# Patient Record
Sex: Female | Born: 1960
Health system: Southern US, Community
[De-identification: ages and names within clinical notes are randomized; demographics above are authoritative.]

## PROBLEM LIST (undated history)

## (undated) DIAGNOSIS — T7840XA Allergy, unspecified, initial encounter: Secondary | ICD-10-CM

## (undated) DIAGNOSIS — M1990 Unspecified osteoarthritis, unspecified site: Secondary | ICD-10-CM

## (undated) DIAGNOSIS — G4730 Sleep apnea, unspecified: Secondary | ICD-10-CM

## (undated) DIAGNOSIS — K219 Gastro-esophageal reflux disease without esophagitis: Secondary | ICD-10-CM

## (undated) DIAGNOSIS — J45909 Unspecified asthma, uncomplicated: Secondary | ICD-10-CM

## (undated) HISTORY — PX: OTHER SURGICAL HISTORY: SHX169

## (undated) HISTORY — DX: Unspecified osteoarthritis, unspecified site: M19.90

## (undated) HISTORY — DX: Unspecified asthma, uncomplicated: J45.909

## (undated) HISTORY — DX: Allergy, unspecified, initial encounter: T78.40XA

## (undated) HISTORY — PX: COLONOSCOPY: SHX174

## (undated) HISTORY — DX: Sleep apnea, unspecified: G47.30

## (undated) HISTORY — DX: Gastro-esophageal reflux disease without esophagitis: K21.9

## (undated) HISTORY — PX: POLYPECTOMY: SHX149

---
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
## 1999-05-27 ENCOUNTER — Encounter: Admission: RE | Admit: 1999-05-27 | Discharge: 1999-05-27 | Payer: Self-pay | Admitting: Orthopaedic Surgery

## 1999-05-27 ENCOUNTER — Encounter: Payer: Self-pay | Admitting: Orthopaedic Surgery

## 2010-02-10 ENCOUNTER — Encounter: Payer: Self-pay | Admitting: Family Medicine

## 2010-04-05 ENCOUNTER — Ambulatory Visit (INDEPENDENT_AMBULATORY_CARE_PROVIDER_SITE_OTHER): Payer: BC Managed Care – PPO | Admitting: Family Medicine

## 2010-04-05 ENCOUNTER — Encounter: Payer: Self-pay | Admitting: Family Medicine

## 2010-04-05 DIAGNOSIS — J45909 Unspecified asthma, uncomplicated: Secondary | ICD-10-CM | POA: Insufficient documentation

## 2010-04-05 DIAGNOSIS — H9209 Otalgia, unspecified ear: Secondary | ICD-10-CM

## 2010-04-05 DIAGNOSIS — J309 Allergic rhinitis, unspecified: Secondary | ICD-10-CM | POA: Insufficient documentation

## 2010-04-05 DIAGNOSIS — R030 Elevated blood-pressure reading, without diagnosis of hypertension: Secondary | ICD-10-CM | POA: Insufficient documentation

## 2010-04-09 NOTE — Assessment & Plan Note (Signed)
Summary: RIGHT EAR HURTING   Vital Signs:  Patient Profile:   50 Years Old Female CC:      Right Ear Pain O2 Sat:      97 % O2 treatment:    Room Air Temp:     98.9 degrees F oral Pulse rate:   96 / minute Pulse rhythm:   regular Resp:     13 per minute BP sitting:   141 / 88  (right arm) Cuff size:   regular  Pt. in pain?   yes    Location:   right ear  Vitals Entered By: Standley Dakins MD (April 05, 2010 1:52 PM)                   Current Allergies (reviewed today): No known allergies History of Present Illness History from: patient Chief Complaint: Right Ear Pain History of Present Illness: The patient presented today for evaluation of right ear pain, getting progressively worse over the past week.  She is reporting that she has had sinus pressure but no other symptoms of a cold or fever or chills.  It has really only been a symptom of the right ear pain that brought her in to be evaluated.  It has not been bad enough for her to have taken any medications so far.     REVIEW OF SYSTEMS Constitutional Symptoms      Denies fever, chills, night sweats, weight loss, weight gain, and fatigue.  Eyes       Denies change in vision, eye pain, eye discharge, glasses, contact lenses, and eye surgery. Ear/Nose/Throat/Mouth       Complains of ear pain.      Denies hearing loss/aids, change in hearing, ear discharge, dizziness, frequent runny nose, frequent nose bleeds, sinus problems, sore throat, hoarseness, and tooth pain or bleeding.      Comments: right ear only Respiratory       Denies dry cough, productive cough, wheezing, shortness of breath, asthma, bronchitis, and emphysema/COPD.  Cardiovascular       Denies murmurs, chest pain, and tires easily with exhertion.    Gastrointestinal       Denies stomach pain, nausea/vomiting, diarrhea, constipation, blood in bowel movements, and indigestion. Genitourniary       Denies painful urination, kidney stones, and loss of  urinary control. Neurological       Denies paralysis, seizures, and fainting/blackouts. Musculoskeletal       Denies muscle pain, joint pain, joint stiffness, decreased range of motion, redness, swelling, muscle weakness, and gout.  Skin       Denies bruising, unusual mles/lumps or sores, and hair/skin or nail changes.  Psych       Denies mood changes, temper/anger issues, anxiety/stress, speech problems, depression, and sleep problems.  Past History:  Family History: Last updated: 04/05/2010 Pt is adopted  Social History: Last updated: 04/05/2010 Never Smoked Alcohol use-no Drug use-no  Risk Factors: Smoking Status: never (04/05/2010)  Past Medical History: Reactive Asthma Allergic Rhinitis Hypertension of pregnancy ?(Pre-eclampsia)  Past Surgical History: Caesarean section - 1997  Family History: Pt is adopted  Social History: Never Smoked Alcohol use-no Drug use-no Smoking Status:  never Drug Use:  no Physical Exam General appearance: well developed, well nourished, no acute distress Head: normocephalic, atraumatic Eyes: conjunctivae and lids normal Pupils: equal, round, reactive to light Ears: normal, no lesions or deformities Nasal: largely swollen red turbinates with congestion Oral/Pharynx: tongue normal, posterior pharynx without erythema or exudate Neck: neck  supple,  trachea midline, no masses Chest/Lungs: no rales, wheezes, or rhonchi bilateral, breath sounds equal without effort Extremities: normal extremities Neurological: grossly intact and non-focal Skin: no obvious rashes or lesions MSE: oriented to time, place, and person Assessment Problems:   New Problems: ALLERGIC RHINITIS CAUSE UNSPECIFIED (ICD-477.9) ELEVATED BP READING WITHOUT DX HYPERTENSION (ICD-796.2) Hx of ASTHMA (ICD-493.90) EAR PAIN, RIGHT (ICD-388.70)   Patient Education: Patient and/or caregiver instructed in the following: rest, fluids, Tylenol prn, exercise, weight  loss. The risks, benefits and possible side effects were clearly explained and discussed with the patient.  The patient verbalized clear understanding.  The patient was given instructions to return if symptoms don't improve, worsen or new changes develop.  If it is not during clinic hours and the patient cannot get back to this clinic then the patient was told to seek medical care at an available urgent care or emergency department.  The patient verbalized understanding.   Demonstrates willingness to comply.  Plan New Medications/Changes: ZYRTEC ALLERGY 10 MG TABS (CETIRIZINE HCL) take 1 by mouth daily for allergies  #30 x 1, 04/05/2010, Clanford Smalling MD ZYRTEC-D ALLERGY & CONGESTION 5-120 MG XR12H-TAB (CETIRIZINE-PSEUDOEPHEDRINE) take 1 by mouth every 12 hours x 3 days THEN take regular zyrtec once daily  #6 x 0, 04/05/2010, Clanford Booze MD FLUTICASONE PROPIONATE 50 MCG/ACT SUSP (FLUTICASONE PROPIONATE) 2 sprays per nostril once daily  #1 x 1, 04/05/2010, Clanford Newcom MD  Follow Up: Follow up in 2-3 days if no improvement, Follow up on an as needed basis, Follow up with Primary Physician  The patient and/or caregiver has been counseled thoroughly with regard to medications prescribed including dosage, schedule, interactions, rationale for use, and possible side effects and they verbalize understanding.  Diagnoses and expected course of recovery discussed and will return if not improved as expected or if the condition worsens. Patient and/or caregiver verbalized understanding.  Prescriptions: ZYRTEC ALLERGY 10 MG TABS (CETIRIZINE HCL) take 1 by mouth daily for allergies  #30 x 1   Entered and Authorized by:   Standley Dakins MD   Signed by:   Standley Dakins MD on 04/05/2010   Method used:   Electronically to        [facility] [address] (retail)       [address], [address]       [place], [place]  [address]       Ph: [phone]       Fax:  [phone]   RxID:   [id number] ZYRTEC-D ALLERGY & CONGESTION 5-120 MG XR12H-TAB (CETIRIZINE-PSEUDOEPHEDRINE) take 1 by mouth every 12 hours x 3 days THEN take regular zyrtec once daily  #6 x 0   Entered and Authorized by:   Standley Dakins MD   Signed by:   Standley Dakins MD on 04/05/2010   Method used:   Electronically to        [facility] [address] (retail)       [address], [address]       [place], [place]  [address]       Ph: [phone]       Fax: [phone]   RxID:   [id number] FLUTICASONE PROPIONATE 50 MCG/ACT SUSP (FLUTICASONE PROPIONATE) 2 sprays per nostril once daily  #1 x 1   Entered and Authorized by:   Standley Dakins MD   Signed by:   Standley Dakins MD  on 04/05/2010   Method used:   Electronically to        [facility] [address] (retail)       [address]       [place], [place]  [address]       Ph: [phone]       Fax: [phone]   RxID:   [id number]   Patient Instructions: 1)  Take Zyrtec D for the next 3 days then start taking Zyrtec 10 mg daily. 2)  Start Flonase (fluticasone) nasal spray - 2 sprays per nostril once daily.  3)  Return or go to the ER if no improvement or symptoms getting worse.   4)  Check your Blood Pressure regularly. If it is above: 140/90 you should make an appointment. 5)  Follow up with your primary care physician to be rechecked in 1 week.   6)  The patient was informed that there is no on-call provider or services available at this clinic during off-hours (when the clinic is closed).  If the patient developed a problem or concern that required immediate attention, the patient was advised to go the the nearest available urgent care or emergency department for medical care.  The patient verbalized understanding.

## 2015-02-20 ENCOUNTER — Other Ambulatory Visit (HOSPITAL_COMMUNITY)
Admission: RE | Admit: 2015-02-20 | Discharge: 2015-02-20 | Disposition: A | Discharge status: Home / Self Care | Payer: BLUE CROSS/BLUE SHIELD | Source: Ambulatory Visit | Attending: Internal Medicine | Admitting: Internal Medicine

## 2015-02-20 DIAGNOSIS — Z01419 Encounter for gynecological examination (general) (routine) without abnormal findings: Secondary | ICD-10-CM | POA: Diagnosis present

## 2015-02-20 DIAGNOSIS — Z1151 Encounter for screening for human papillomavirus (HPV): Secondary | ICD-10-CM | POA: Diagnosis present

## 2015-03-21 ENCOUNTER — Telehealth: Payer: Self-pay

## 2015-03-21 ENCOUNTER — Ambulatory Visit (AMBULATORY_SURGERY_CENTER): Payer: Self-pay

## 2015-03-21 VITALS — Ht 64.0 in | Wt 279.0 lb

## 2015-03-21 DIAGNOSIS — Z1211 Encounter for screening for malignant neoplasm of colon: Secondary | ICD-10-CM

## 2015-03-21 MED ORDER — NA SULFATE-K SULFATE-MG SULF 17.5-3.13-1.6 GM/177ML PO SOLN: 1.0000 | Freq: Once | ORAL | Status: DC

## 2015-03-21 NOTE — Telephone Encounter (Signed)
Called pt to advise no need to hold diet drug (Contrave) per Lucretia Kern, CRNA prior to colonoscopy. Abigail Butts PV

## 2015-03-21 NOTE — Progress Notes (Signed)
No egg or soy allergies Not on home 02 Pt experienced hypotension post procedure (once), no other anesthesia complications. Emmi video declined.  Pt agreed to hold diet drug (Contrave) for 10 days prior to procedure, starting on 03-27-2015.

## 2015-04-06 ENCOUNTER — Encounter: Payer: Self-pay | Admitting: Gastroenterology

## 2015-04-06 ENCOUNTER — Ambulatory Visit (AMBULATORY_SURGERY_CENTER): Payer: BLUE CROSS/BLUE SHIELD | Admitting: Gastroenterology

## 2015-04-06 VITALS — BP 131/69 | HR 90 | Temp 97.5°F | Resp 17 | Ht 64.0 in | Wt 279.0 lb

## 2015-04-06 DIAGNOSIS — D124 Benign neoplasm of descending colon: Secondary | ICD-10-CM

## 2015-04-06 DIAGNOSIS — Z1211 Encounter for screening for malignant neoplasm of colon: Secondary | ICD-10-CM

## 2015-04-06 DIAGNOSIS — D125 Benign neoplasm of sigmoid colon: Secondary | ICD-10-CM | POA: Diagnosis not present

## 2015-04-06 MED ORDER — SODIUM CHLORIDE 0.9 % IV SOLN: 500.0000 mL | INTRAVENOUS | Status: DC

## 2015-04-06 NOTE — Progress Notes (Signed)
Called to room to assist during endoscopic procedure.  Patient ID and intended procedure confirmed with present staff. Received instructions for my participation in the procedure from the performing physician.  

## 2015-04-06 NOTE — Progress Notes (Signed)
No egg or soy allergy known to patient  No issues with past sedation with any surgeries  or procedures, no intubation problems  No diet pills per patient No home 02 use per patient  No blood thinners per patient  Pt denies issues with constipation   

## 2015-04-06 NOTE — Progress Notes (Signed)
A and O x 3 Report to Tracey RN 

## 2015-04-06 NOTE — Op Note (Signed)
Julie Grimes Patient Name: Julie Grimes Procedure Date: 04/06/2015 10:36 AM MRN: [id number] Endoscopist: Mauri Pole , MD Age: 55 Referring MD:  Date of Birth: [birth year] Gender: Female Procedure:                Colonoscopy Indications:              Screening for colorectal malignant neoplasm Medicines:                Propofol total dose 400 mg IV, Monitored Anesthesia                            Care Procedure:                Pre-Anesthesia Assessment:                           - Prior to the procedure, a History and Physical                            was performed, and patient medications and                            allergies were reviewed. The patient's tolerance of                            previous anesthesia was also reviewed. The risks                            and benefits of the procedure and the sedation                            options and risks were discussed with the patient.                            All questions were answered, and informed consent                            was obtained. Prior Anticoagulants: The patient has                            taken no previous anticoagulant or antiplatelet                            agents. ASA Grade Assessment: II - A patient with                            mild systemic disease. After reviewing the risks                            and benefits, the patient was deemed in                            satisfactory condition to undergo the procedure.  After obtaining informed consent, the colonoscope                            was passed under direct vision. Throughout the                            procedure, the patient's blood pressure, pulse, and                            oxygen saturations were monitored continuously. The                            Model CF-HQ190L [id number]) scope was introduced                            through the anus and advanced to the the cecum,                          identified by appendiceal orifice and ileocecal                            valve. The colonoscopy was performed without                            difficulty. The patient tolerated the procedure                            well. The quality of the bowel preparation was                            adequate. The quality of the bowel preparation was                            adequate. The ileocecal valve, appendiceal orifice,                            and rectum were photographed. Scope In: 10:51:16 AM Scope Out: 11:12:48 AM Scope Withdrawal Time: 0 hours 19 minutes 13 seconds  Total Procedure Duration: 0 hours 21 minutes 32 seconds  Findings:      The perianal and digital rectal examinations were normal.      A 8 mm polyp was found in the descending colon. The polyp was sessile.       The polyp was removed with a cold snare. Resection and retrieval were       complete.      A 25 mm polyp was found in the sigmoid colon. The polyp was       pedunculated. The polyp was removed with a hot snare. Resection and       retrieval were complete. To prevent bleeding after the polypectomy, two       hemostatic clips were successfully placed. There was no bleeding at the       end of the procedure. Complications:            No immediate complications. Estimated Blood Loss:     Estimated blood loss was minimal. Impression:               -  One 8 mm polyp in the descending colon, removed                            with a cold snare. Resected and retrieved.                           - One 25 mm polyp in the sigmoid colon, removed                            with a hot snare. Resected and retrieved. Clips                            were placed. Recommendation:           - Patient has a contact number available for                            emergencies. The signs and symptoms of potential                            delayed complications were discussed with the                             patient. Return to normal activities tomorrow.                            Written discharge instructions were provided to the                            patient.                           - Resume previous diet.                           - Continue present medications.                           - Await pathology results.                           - Repeat colonoscopy in 3 years for surveillance                            based on pathology results. Procedure Code(s):        --- Professional ---                           [phone], Colonoscopy, flexible; with removal of                            tumor(s), polyp(s), or other lesion(s) by snare                            technique CPT copyright 2016 [employer]. All rights reserved. Mauri Pole, MD 04/06/2015  11:19:46 AM This report has been signed electronically. Number of Addenda: 0

## 2015-04-06 NOTE — Patient Instructions (Signed)
Impressions/recommendations:  Polyps (handout given)  Repeat colonoscopy pending pathology results.  YOU HAD AN ENDOSCOPIC PROCEDURE TODAY AT THE [facility]:   Refer to the procedure report that was given to you for any specific questions about what was found during the examination.  If the procedure report does not answer your questions, please call your gastroenterologist to clarify.  If you requested that your care partner not be given the details of your procedure findings, then the procedure report has been included in a sealed envelope for you to review at your convenience later.  YOU SHOULD EXPECT: Some feelings of bloating in the abdomen. Passage of more gas than usual.  Walking can help get rid of the air that was put into your GI tract during the procedure and reduce the bloating. If you had a lower endoscopy (such as a colonoscopy or flexible sigmoidoscopy) you may notice spotting of blood in your stool or on the toilet paper. If you underwent a bowel prep for your procedure, you may not have a normal bowel movement for a few days.  Please Note:  You might notice some irritation and congestion in your nose or some drainage.  This is from the oxygen used during your procedure.  There is no need for concern and it should clear up in a day or so.  SYMPTOMS TO REPORT IMMEDIATELY:   Following lower endoscopy (colonoscopy or flexible sigmoidoscopy):  Excessive amounts of blood in the stool  Significant tenderness or worsening of abdominal pains  Swelling of the abdomen that is new, acute  Fever of 100F or higher  For urgent or emergent issues, a gastroenterologist can be reached at any hour by calling [phone].   DIET: Your first meal following the procedure should be a small meal and then it is ok to progress to your normal diet. Heavy or fried foods are harder to digest and may make you feel nauseous or bloated.  Likewise, meals heavy in dairy and vegetables can  increase bloating.  Drink plenty of fluids but you should avoid alcoholic beverages for 24 hours.  ACTIVITY:  You should plan to take it easy for the rest of today and you should NOT DRIVE or use heavy machinery until tomorrow (because of the sedation medicines used during the test).    FOLLOW UP: Our staff will call the number listed on your records the next business day following your procedure to check on you and address any questions or concerns that you may have regarding the information given to you following your procedure. If we do not reach you, we will leave a message.  However, if you are feeling well and you are not experiencing any problems, there is no need to return our call.  We will assume that you have returned to your regular daily activities without incident.  If any biopsies were taken you will be contacted by phone or by letter within the next 1-3 weeks.  Please call us at [phone] if you have not heard about the biopsies in 3 weeks.    SIGNATURES/CONFIDENTIALITY: You and/or your care partner have signed paperwork which will be entered into your electronic medical record.  These signatures attest to the fact that that the information above on your After Visit Summary has been reviewed and is understood.  Full responsibility of the confidentiality of this discharge information lies with you and/or your care-partner. 

## 2015-04-09 ENCOUNTER — Telehealth: Payer: Self-pay | Admitting: *Deleted

## 2015-04-09 NOTE — Telephone Encounter (Signed)
  Follow up Call-  Call back number 04/06/2015  Post procedure Call Back phone  # [phone]  Permission to leave phone message Yes     Patient questions:  Do you have a fever, pain , or abdominal swelling? No. Pain Score  0 *  Have you tolerated food without any problems? Yes.    Have you been able to return to your normal activities? Yes.    Do you have any questions about your discharge instructions: Diet   No. Medications  No. Follow up visit  No.  Do you have questions or concerns about your Care? No.  Actions: * If pain score is 4 or above: No action needed, pain <4.

## 2015-04-11 DIAGNOSIS — G4733 Obstructive sleep apnea (adult) (pediatric): Secondary | ICD-10-CM | POA: Diagnosis not present

## 2015-04-16 ENCOUNTER — Encounter: Payer: Self-pay | Admitting: Gastroenterology

## 2015-05-12 DIAGNOSIS — G4733 Obstructive sleep apnea (adult) (pediatric): Secondary | ICD-10-CM | POA: Diagnosis not present

## 2015-06-04 DIAGNOSIS — G4733 Obstructive sleep apnea (adult) (pediatric): Secondary | ICD-10-CM | POA: Diagnosis not present

## 2015-06-11 DIAGNOSIS — G4733 Obstructive sleep apnea (adult) (pediatric): Secondary | ICD-10-CM | POA: Diagnosis not present

## 2015-07-12 DIAGNOSIS — G4733 Obstructive sleep apnea (adult) (pediatric): Secondary | ICD-10-CM | POA: Diagnosis not present

## 2015-07-17 DIAGNOSIS — G4733 Obstructive sleep apnea (adult) (pediatric): Secondary | ICD-10-CM | POA: Diagnosis not present

## 2015-08-24 DIAGNOSIS — G4733 Obstructive sleep apnea (adult) (pediatric): Secondary | ICD-10-CM | POA: Diagnosis not present

## 2015-10-20 DIAGNOSIS — G4733 Obstructive sleep apnea (adult) (pediatric): Secondary | ICD-10-CM | POA: Diagnosis not present

## 2015-11-20 DIAGNOSIS — G4733 Obstructive sleep apnea (adult) (pediatric): Secondary | ICD-10-CM | POA: Diagnosis not present

## 2015-12-03 DIAGNOSIS — H524 Presbyopia: Secondary | ICD-10-CM | POA: Diagnosis not present

## 2015-12-03 DIAGNOSIS — H5203 Hypermetropia, bilateral: Secondary | ICD-10-CM | POA: Diagnosis not present

## 2015-12-20 DIAGNOSIS — G4733 Obstructive sleep apnea (adult) (pediatric): Secondary | ICD-10-CM | POA: Diagnosis not present

## 2016-02-17 DIAGNOSIS — Z23 Encounter for immunization: Secondary | ICD-10-CM | POA: Diagnosis not present

## 2016-02-18 DIAGNOSIS — Z1321 Encounter for screening for nutritional disorder: Secondary | ICD-10-CM | POA: Diagnosis not present

## 2016-02-18 DIAGNOSIS — Z0000 Encounter for general adult medical examination without abnormal findings: Secondary | ICD-10-CM | POA: Diagnosis not present

## 2016-02-18 DIAGNOSIS — Z13220 Encounter for screening for lipoid disorders: Secondary | ICD-10-CM | POA: Diagnosis not present

## 2016-02-21 DIAGNOSIS — Z86010 Personal history of colonic polyps: Secondary | ICD-10-CM | POA: Diagnosis not present

## 2016-02-21 DIAGNOSIS — M170 Bilateral primary osteoarthritis of knee: Secondary | ICD-10-CM | POA: Diagnosis not present

## 2016-02-21 DIAGNOSIS — D72829 Elevated white blood cell count, unspecified: Secondary | ICD-10-CM | POA: Diagnosis not present

## 2016-02-21 DIAGNOSIS — N393 Stress incontinence (female) (male): Secondary | ICD-10-CM | POA: Diagnosis not present

## 2016-02-21 DIAGNOSIS — Z0001 Encounter for general adult medical examination with abnormal findings: Secondary | ICD-10-CM | POA: Diagnosis not present

## 2016-02-25 DIAGNOSIS — G4733 Obstructive sleep apnea (adult) (pediatric): Secondary | ICD-10-CM | POA: Diagnosis not present

## 2016-03-21 DIAGNOSIS — E559 Vitamin D deficiency, unspecified: Secondary | ICD-10-CM | POA: Diagnosis not present

## 2016-03-31 DIAGNOSIS — G4733 Obstructive sleep apnea (adult) (pediatric): Secondary | ICD-10-CM | POA: Diagnosis not present

## 2016-04-25 DIAGNOSIS — G4733 Obstructive sleep apnea (adult) (pediatric): Secondary | ICD-10-CM | POA: Diagnosis not present

## 2016-05-20 DIAGNOSIS — Z6841 Body Mass Index (BMI) 40.0 and over, adult: Secondary | ICD-10-CM | POA: Diagnosis not present

## 2016-05-26 DIAGNOSIS — G4733 Obstructive sleep apnea (adult) (pediatric): Secondary | ICD-10-CM | POA: Diagnosis not present

## 2016-06-04 DIAGNOSIS — G4733 Obstructive sleep apnea (adult) (pediatric): Secondary | ICD-10-CM | POA: Diagnosis not present

## 2016-10-09 DIAGNOSIS — G4733 Obstructive sleep apnea (adult) (pediatric): Secondary | ICD-10-CM | POA: Diagnosis not present

## 2016-12-08 DIAGNOSIS — Z23 Encounter for immunization: Secondary | ICD-10-CM | POA: Diagnosis not present

## 2016-12-08 DIAGNOSIS — Z6841 Body Mass Index (BMI) 40.0 and over, adult: Secondary | ICD-10-CM | POA: Diagnosis not present

## 2017-01-09 DIAGNOSIS — G4733 Obstructive sleep apnea (adult) (pediatric): Secondary | ICD-10-CM | POA: Diagnosis not present

## 2017-02-23 DIAGNOSIS — E559 Vitamin D deficiency, unspecified: Secondary | ICD-10-CM | POA: Diagnosis not present

## 2017-02-23 DIAGNOSIS — Z0000 Encounter for general adult medical examination without abnormal findings: Secondary | ICD-10-CM | POA: Diagnosis not present

## 2017-02-23 DIAGNOSIS — N390 Urinary tract infection, site not specified: Secondary | ICD-10-CM | POA: Diagnosis not present

## 2017-02-26 DIAGNOSIS — G4733 Obstructive sleep apnea (adult) (pediatric): Secondary | ICD-10-CM | POA: Diagnosis not present

## 2017-02-26 DIAGNOSIS — N393 Stress incontinence (female) (male): Secondary | ICD-10-CM | POA: Diagnosis not present

## 2017-02-26 DIAGNOSIS — M170 Bilateral primary osteoarthritis of knee: Secondary | ICD-10-CM | POA: Diagnosis not present

## 2017-02-26 DIAGNOSIS — Z0000 Encounter for general adult medical examination without abnormal findings: Secondary | ICD-10-CM | POA: Diagnosis not present

## 2017-04-10 DIAGNOSIS — G4733 Obstructive sleep apnea (adult) (pediatric): Secondary | ICD-10-CM | POA: Diagnosis not present

## 2017-05-26 DIAGNOSIS — Z01419 Encounter for gynecological examination (general) (routine) without abnormal findings: Secondary | ICD-10-CM | POA: Diagnosis not present

## 2017-05-26 DIAGNOSIS — Z23 Encounter for immunization: Secondary | ICD-10-CM | POA: Diagnosis not present

## 2017-05-26 DIAGNOSIS — Z1151 Encounter for screening for human papillomavirus (HPV): Secondary | ICD-10-CM | POA: Diagnosis not present

## 2017-05-26 DIAGNOSIS — Z1212 Encounter for screening for malignant neoplasm of rectum: Secondary | ICD-10-CM | POA: Diagnosis not present

## 2017-07-09 DIAGNOSIS — G4733 Obstructive sleep apnea (adult) (pediatric): Secondary | ICD-10-CM | POA: Diagnosis not present

## 2017-10-07 DIAGNOSIS — G4733 Obstructive sleep apnea (adult) (pediatric): Secondary | ICD-10-CM | POA: Diagnosis not present

## 2017-10-09 DIAGNOSIS — Z23 Encounter for immunization: Secondary | ICD-10-CM | POA: Diagnosis not present

## 2018-01-05 DIAGNOSIS — G4733 Obstructive sleep apnea (adult) (pediatric): Secondary | ICD-10-CM | POA: Diagnosis not present

## 2018-02-25 DIAGNOSIS — Z0000 Encounter for general adult medical examination without abnormal findings: Secondary | ICD-10-CM | POA: Diagnosis not present

## 2018-02-25 DIAGNOSIS — Z791 Long term (current) use of non-steroidal anti-inflammatories (NSAID): Secondary | ICD-10-CM | POA: Diagnosis not present

## 2018-02-25 DIAGNOSIS — D72829 Elevated white blood cell count, unspecified: Secondary | ICD-10-CM | POA: Diagnosis not present

## 2018-02-25 DIAGNOSIS — Z1159 Encounter for screening for other viral diseases: Secondary | ICD-10-CM | POA: Diagnosis not present

## 2018-03-01 DIAGNOSIS — Z0000 Encounter for general adult medical examination without abnormal findings: Secondary | ICD-10-CM | POA: Diagnosis not present

## 2018-03-01 DIAGNOSIS — Z1159 Encounter for screening for other viral diseases: Secondary | ICD-10-CM | POA: Diagnosis not present

## 2018-03-01 DIAGNOSIS — Z8709 Personal history of other diseases of the respiratory system: Secondary | ICD-10-CM | POA: Diagnosis not present

## 2018-03-01 DIAGNOSIS — N393 Stress incontinence (female) (male): Secondary | ICD-10-CM | POA: Diagnosis not present

## 2018-03-10 ENCOUNTER — Other Ambulatory Visit: Payer: Self-pay | Admitting: Internal Medicine

## 2018-03-10 DIAGNOSIS — Z1231 Encounter for screening mammogram for malignant neoplasm of breast: Secondary | ICD-10-CM

## 2018-03-10 DIAGNOSIS — Z780 Asymptomatic menopausal state: Secondary | ICD-10-CM

## 2018-04-06 DIAGNOSIS — G4733 Obstructive sleep apnea (adult) (pediatric): Secondary | ICD-10-CM | POA: Diagnosis not present

## 2018-05-07 ENCOUNTER — Ambulatory Visit: Payer: BLUE CROSS/BLUE SHIELD

## 2018-05-07 ENCOUNTER — Other Ambulatory Visit: Payer: BLUE CROSS/BLUE SHIELD

## 2018-05-27 ENCOUNTER — Encounter: Payer: Self-pay | Admitting: Gastroenterology

## 2018-06-22 ENCOUNTER — Other Ambulatory Visit: Payer: Self-pay

## 2018-06-22 ENCOUNTER — Ambulatory Visit
Admission: EM | Admit: 2018-06-22 | Discharge: 2018-06-22 | Disposition: A | Discharge status: Home / Self Care | Payer: BC Managed Care – PPO | Attending: Physician Assistant | Admitting: Physician Assistant

## 2018-06-22 DIAGNOSIS — L259 Unspecified contact dermatitis, unspecified cause: Secondary | ICD-10-CM

## 2018-06-22 DIAGNOSIS — R21 Rash and other nonspecific skin eruption: Secondary | ICD-10-CM

## 2018-06-22 MED ORDER — PREDNISONE 50 MG PO TABS: 50.0000 mg | ORAL_TABLET | Freq: Every day | ORAL | 0 refills | Status: DC

## 2018-06-22 NOTE — ED Provider Notes (Signed)
[facility]    CSN: [id number] Arrival date & time: 06/22/18  1442     History   Chief Complaint Chief Complaint  Patient presents with  . Rash    HPI Julie Grimes is a 58 y.o. female.   58 year old female comes in for 3 day history of rash. States started at the right posterior thigh, and has not spread to buttock, inner thigh and axilla. Area is itching in sensation with swelling. Denies pain. Denies spreading erythema, warmth, fever. Denies obvious new contact/exposures. States she does have pets, and unsure if maybe they brought something in. Has been using otc cream without relief.      Past Medical History:  Diagnosis Date  . Arthritis    osteoarthritis  . Asthma    reactive  . GERD (gastroesophageal reflux disease)   . Sleep apnea    cpap nightly    Patient Active Problem List   Diagnosis Date Noted  . ALLERGIC RHINITIS CAUSE UNSPECIFIED 04/05/2010  . ASTHMA 04/05/2010  . ELEVATED BP READING WITHOUT DX HYPERTENSION 04/05/2010    Past Surgical History:  Procedure Laterality Date  . CESAREAN SECTION  1997  . WISDOM TOOTH EXTRACTION  1981    OB History   No obstetric history on file.      Home Medications    Prior to Admission medications   Medication Sig Start Date End Date Taking? Authorizing Provider  mirabegron ER (MYRBETRIQ) 50 MG TB24 tablet Take by mouth 2 (two) times daily.     [provider]  Naltrexone-Bupropion HCl ER (CONTRAVE) 8-90 MG TB12 Take by mouth.    [provider]  naproxen sodium (ANAPROX) 220 MG tablet Take 220 mg by mouth as needed.    [provider]  predniSONE (DELTASONE) 50 MG tablet Take 1 tablet (50 mg total) by mouth daily with breakfast. 06/22/18   Ok Edwards, PA-C    Family History Family History  Adopted: Yes    Social History Social History   Tobacco Use  . Smoking status: Never Smoker  . Smokeless tobacco: Never Used  Substance Use Topics  . Alcohol use: Yes   Alcohol/week: 0.0 standard drinks    Comment: occasionally  . Drug use: No     Allergies   Stadol [butorphanol]   Review of Systems Review of Systems  Reason unable to perform ROS: See HPI as above.     Physical Exam Triage Vital Signs ED Triage Vitals  Enc Vitals Group     BP 06/22/18 1502 128/78     Pulse Rate 06/22/18 1502 98     Resp 06/22/18 1502 20     Temp 06/22/18 1502 98.9 F (37.2 C)     Temp Source 06/22/18 1502 Oral     SpO2 06/22/18 1502 98 %     Weight --      Height --      Head Circumference --      Peak Flow --      Pain Score 06/22/18 1503 0     Pain Loc --      Pain Edu? --      Excl. in [place]? --    No data found.  Updated Vital Signs BP 128/78 (BP Location: Left Arm)   Pulse 98   Temp 98.9 F (37.2 C) (Oral)   Resp 20   LMP 06/15/2018   SpO2 98%   Physical Exam Constitutional:      General: She  is not in acute distress.    Appearance: She is well-developed. She is not diaphoretic.  HENT:     Head: Normocephalic and atraumatic.  Eyes:     Conjunctiva/sclera: Conjunctivae normal.     Pupils: Pupils are equal, round, and reactive to light.  Skin:    Comments: Erythema with maculopapular rash along the right posterior thigh extending to the buttock. No moisture/shiny red skin. No pain on palpation. Similar rash to the axilla. Warm to palpation.   Neurological:     Mental Status: She is alert and oriented to person, place, and time.      UC Treatments / Results  Labs (all labs ordered are listed, but only abnormal results are displayed) Labs Reviewed - No data to display  EKG None  Radiology No results found.  Procedures Procedures (including critical care time)  Medications Ordered in UC Medications - No data to display  Initial Impression / Assessment and Plan / UC Course  I have reviewed the triage vital signs and the nursing notes.  Pertinent labs & imaging results that were available during my care of the patient  were reviewed by me and considered in my medical decision making (see chart for details).    Discussed possible contact dermatitis vs tinea infection causing symptoms. Given not only in skin folds, history and exam more likely contact dermatitis will provide prednisone at this time. Patient to continue to monitor. Return precautions given. Patient expresses understanding and agrees to plan.  Final Clinical Impressions(s) / UC Diagnoses   Final diagnoses:  Contact dermatitis, unspecified contact dermatitis type, unspecified trigger   ED Prescriptions    Medication Sig Dispense Auth. Provider   predniSONE (DELTASONE) 50 MG tablet Take 1 tablet (50 mg total) by mouth daily with breakfast. 5 tablet Tobin Chad, [place] 06/22/18 1623

## 2018-06-22 NOTE — Discharge Instructions (Signed)
Start prednisone as directed. Ice compress. Continue zyrtec, add on benadryl as needed. Monitor for spreading redness, increased warmth, pain, fever, follow up for reevaluation needed.

## 2018-06-22 NOTE — ED Triage Notes (Signed)
Pt c/o rash to back of rt leg going up to buttocks, now to lt leg and under both arms. State's this is the 3 day using OTC meds with no relief

## 2018-06-24 DIAGNOSIS — L237 Allergic contact dermatitis due to plants, except food: Secondary | ICD-10-CM | POA: Diagnosis not present

## 2018-07-06 DIAGNOSIS — G4733 Obstructive sleep apnea (adult) (pediatric): Secondary | ICD-10-CM | POA: Diagnosis not present

## 2018-07-07 ENCOUNTER — Ambulatory Visit: Payer: BLUE CROSS/BLUE SHIELD

## 2018-07-07 ENCOUNTER — Inpatient Hospital Stay: Admission: RE | Admit: 2018-07-07 | Payer: BLUE CROSS/BLUE SHIELD | Source: Ambulatory Visit

## 2018-07-21 DIAGNOSIS — Z01419 Encounter for gynecological examination (general) (routine) without abnormal findings: Secondary | ICD-10-CM | POA: Diagnosis not present

## 2018-07-21 DIAGNOSIS — Z1212 Encounter for screening for malignant neoplasm of rectum: Secondary | ICD-10-CM | POA: Diagnosis not present

## 2018-10-04 DIAGNOSIS — G4733 Obstructive sleep apnea (adult) (pediatric): Secondary | ICD-10-CM | POA: Diagnosis not present

## 2018-11-10 DIAGNOSIS — Z23 Encounter for immunization: Secondary | ICD-10-CM | POA: Diagnosis not present

## 2019-01-03 DIAGNOSIS — G4733 Obstructive sleep apnea (adult) (pediatric): Secondary | ICD-10-CM | POA: Diagnosis not present

## 2019-03-03 DIAGNOSIS — Z0000 Encounter for general adult medical examination without abnormal findings: Secondary | ICD-10-CM | POA: Diagnosis not present

## 2019-03-08 DIAGNOSIS — G4733 Obstructive sleep apnea (adult) (pediatric): Secondary | ICD-10-CM | POA: Diagnosis not present

## 2019-03-08 DIAGNOSIS — Z9989 Dependence on other enabling machines and devices: Secondary | ICD-10-CM | POA: Diagnosis not present

## 2019-03-08 DIAGNOSIS — Z8709 Personal history of other diseases of the respiratory system: Secondary | ICD-10-CM | POA: Diagnosis not present

## 2019-03-08 DIAGNOSIS — Z0000 Encounter for general adult medical examination without abnormal findings: Secondary | ICD-10-CM | POA: Diagnosis not present

## 2019-03-17 DIAGNOSIS — R7301 Impaired fasting glucose: Secondary | ICD-10-CM | POA: Diagnosis not present

## 2019-03-17 DIAGNOSIS — E8881 Metabolic syndrome: Secondary | ICD-10-CM | POA: Diagnosis not present

## 2019-04-19 DIAGNOSIS — F329 Major depressive disorder, single episode, unspecified: Secondary | ICD-10-CM | POA: Diagnosis not present

## 2019-04-28 DIAGNOSIS — E8881 Metabolic syndrome: Secondary | ICD-10-CM | POA: Diagnosis not present

## 2019-04-28 DIAGNOSIS — R7301 Impaired fasting glucose: Secondary | ICD-10-CM | POA: Diagnosis not present

## 2019-07-22 DIAGNOSIS — Z1151 Encounter for screening for human papillomavirus (HPV): Secondary | ICD-10-CM | POA: Diagnosis not present

## 2019-07-22 DIAGNOSIS — Z01419 Encounter for gynecological examination (general) (routine) without abnormal findings: Secondary | ICD-10-CM | POA: Diagnosis not present

## 2019-07-22 DIAGNOSIS — F329 Major depressive disorder, single episode, unspecified: Secondary | ICD-10-CM | POA: Diagnosis not present

## 2019-11-17 DIAGNOSIS — R7301 Impaired fasting glucose: Secondary | ICD-10-CM | POA: Diagnosis not present

## 2019-11-17 DIAGNOSIS — E8881 Metabolic syndrome: Secondary | ICD-10-CM | POA: Diagnosis not present

## 2020-01-02 DIAGNOSIS — G4733 Obstructive sleep apnea (adult) (pediatric): Secondary | ICD-10-CM | POA: Diagnosis not present

## 2020-03-06 DIAGNOSIS — E7800 Pure hypercholesterolemia, unspecified: Secondary | ICD-10-CM | POA: Diagnosis not present

## 2020-03-06 DIAGNOSIS — E8881 Metabolic syndrome: Secondary | ICD-10-CM | POA: Diagnosis not present

## 2020-03-06 DIAGNOSIS — Z0000 Encounter for general adult medical examination without abnormal findings: Secondary | ICD-10-CM | POA: Diagnosis not present

## 2020-03-07 ENCOUNTER — Other Ambulatory Visit: Payer: Self-pay | Admitting: Internal Medicine

## 2020-03-07 DIAGNOSIS — E7800 Pure hypercholesterolemia, unspecified: Secondary | ICD-10-CM

## 2020-03-08 ENCOUNTER — Other Ambulatory Visit: Payer: Self-pay | Admitting: Internal Medicine

## 2020-03-08 DIAGNOSIS — Z1231 Encounter for screening mammogram for malignant neoplasm of breast: Secondary | ICD-10-CM

## 2020-03-10 ENCOUNTER — Ambulatory Visit
Admission: RE | Admit: 2020-03-10 | Discharge: 2020-03-10 | Disposition: A | Discharge status: Home / Self Care | Payer: BC Managed Care – PPO | Source: Ambulatory Visit | Attending: Internal Medicine | Admitting: Internal Medicine

## 2020-03-10 ENCOUNTER — Other Ambulatory Visit: Payer: Self-pay

## 2020-03-10 DIAGNOSIS — Z1231 Encounter for screening mammogram for malignant neoplasm of breast: Secondary | ICD-10-CM | POA: Diagnosis not present

## 2020-03-13 DIAGNOSIS — E7800 Pure hypercholesterolemia, unspecified: Secondary | ICD-10-CM | POA: Diagnosis not present

## 2020-03-13 DIAGNOSIS — R7301 Impaired fasting glucose: Secondary | ICD-10-CM | POA: Diagnosis not present

## 2020-03-13 DIAGNOSIS — Z23 Encounter for immunization: Secondary | ICD-10-CM | POA: Diagnosis not present

## 2020-03-13 DIAGNOSIS — Z0001 Encounter for general adult medical examination with abnormal findings: Secondary | ICD-10-CM | POA: Diagnosis not present

## 2020-03-13 DIAGNOSIS — F329 Major depressive disorder, single episode, unspecified: Secondary | ICD-10-CM | POA: Diagnosis not present

## 2020-03-15 ENCOUNTER — Encounter: Payer: Self-pay | Admitting: Gastroenterology

## 2020-03-16 ENCOUNTER — Other Ambulatory Visit: Payer: Self-pay | Admitting: Internal Medicine

## 2020-03-16 DIAGNOSIS — R928 Other abnormal and inconclusive findings on diagnostic imaging of breast: Secondary | ICD-10-CM

## 2020-03-27 ENCOUNTER — Ambulatory Visit
Admission: RE | Admit: 2020-03-27 | Discharge: 2020-03-27 | Disposition: A | Discharge status: Home / Self Care | Payer: No Typology Code available for payment source | Source: Ambulatory Visit | Attending: Internal Medicine | Admitting: Internal Medicine

## 2020-03-27 DIAGNOSIS — E7800 Pure hypercholesterolemia, unspecified: Secondary | ICD-10-CM

## 2020-03-27 DIAGNOSIS — Z136 Encounter for screening for cardiovascular disorders: Secondary | ICD-10-CM | POA: Diagnosis not present

## 2020-03-29 ENCOUNTER — Ambulatory Visit
Admission: RE | Admit: 2020-03-29 | Discharge: 2020-03-29 | Disposition: A | Discharge status: Home / Self Care | Payer: BC Managed Care – PPO | Source: Ambulatory Visit | Attending: Internal Medicine | Admitting: Internal Medicine

## 2020-03-29 ENCOUNTER — Other Ambulatory Visit: Payer: Self-pay

## 2020-03-29 DIAGNOSIS — R928 Other abnormal and inconclusive findings on diagnostic imaging of breast: Secondary | ICD-10-CM

## 2020-03-29 DIAGNOSIS — N6011 Diffuse cystic mastopathy of right breast: Secondary | ICD-10-CM | POA: Diagnosis not present

## 2020-04-02 DIAGNOSIS — G4733 Obstructive sleep apnea (adult) (pediatric): Secondary | ICD-10-CM | POA: Diagnosis not present

## 2020-05-08 ENCOUNTER — Ambulatory Visit (AMBULATORY_SURGERY_CENTER): Payer: Self-pay | Admitting: *Deleted

## 2020-05-08 ENCOUNTER — Other Ambulatory Visit: Payer: Self-pay

## 2020-05-08 VITALS — Ht 64.0 in | Wt 261.0 lb

## 2020-05-08 DIAGNOSIS — Z860100 Personal history of colon polyps, unspecified: Secondary | ICD-10-CM

## 2020-05-08 NOTE — Progress Notes (Signed)
No egg or soy allergy known to patient  No issues with past sedation with any surgeries or procedures Patient denies ever being told they had issues or difficulty with intubation  No FH of Malignant Hyperthermia No diet pills per patient No home 02 use per patient  No blood thinners per patient  Pt denies issues with constipation  No A fib or A flutter  EMMI video to pt or via [place] 19 guidelines implemented in [place] today with Pt and RN  Pt is fully vaccinated  for Covid   Due to the COVID-19 pandemic we are asking patients to follow certain guidelines.  Pt aware of COVID protocols and [facility] guidelines   Pt states  Last colon  Jaw extremely sore post colon from jaw thrust per [facility]- she is concerned about this happening again   With pt's insurance the only prep covered id Golytely- pt states she did not complete the Suprep last 2017 colon and she knows she cannot drink that volume- changed her to Dr Maryln Manuel  With Mag Citrate prep

## 2020-05-22 ENCOUNTER — Other Ambulatory Visit: Payer: Self-pay

## 2020-05-22 ENCOUNTER — Encounter: Payer: Self-pay | Admitting: Gastroenterology

## 2020-05-22 ENCOUNTER — Ambulatory Visit (AMBULATORY_SURGERY_CENTER): Payer: BC Managed Care – PPO | Admitting: Gastroenterology

## 2020-05-22 VITALS — BP 124/79 | HR 85 | Temp 97.3°F | Resp 20 | Ht 64.0 in | Wt 261.0 lb

## 2020-05-22 DIAGNOSIS — Z86010 Personal history of colonic polyps: Secondary | ICD-10-CM

## 2020-05-22 DIAGNOSIS — Z1211 Encounter for screening for malignant neoplasm of colon: Secondary | ICD-10-CM | POA: Diagnosis not present

## 2020-05-22 DIAGNOSIS — K621 Rectal polyp: Secondary | ICD-10-CM

## 2020-05-22 DIAGNOSIS — D128 Benign neoplasm of rectum: Secondary | ICD-10-CM

## 2020-05-22 MED ORDER — SODIUM CHLORIDE 0.9 % IV SOLN
500.0000 mL | Freq: Once | INTRAVENOUS | Status: DC
Start: 2020-05-22 — End: 2020-05-22

## 2020-05-22 NOTE — Patient Instructions (Signed)
Please read over handouts about polyps, diverticulosis and hemorrhoids  Await pathology- next colonoscopy in 5 years  Continue your normal medications  YOU HAD AN ENDOSCOPIC PROCEDURE TODAY AT [facility]:   Refer to the procedure report that was given to you for any specific questions about what was found during the examination.  If the procedure report does not answer your questions, please call your gastroenterologist to clarify.  If you requested that your care partner not be given the details of your procedure findings, then the procedure report has been included in a sealed envelope for you to review at your convenience later.  YOU SHOULD EXPECT: Some feelings of bloating in the abdomen. Passage of more gas than usual.  Walking can help get rid of the air that was put into your GI tract during the procedure and reduce the bloating. If you had a lower endoscopy (such as a colonoscopy or flexible sigmoidoscopy) you may notice spotting of blood in your stool or on the toilet paper. If you underwent a bowel prep for your procedure, you may not have a normal bowel movement for a few days.  Please Note:  You might notice some irritation and congestion in your nose or some drainage.  This is from the oxygen used during your procedure.  There is no need for concern and it should clear up in a day or so.  SYMPTOMS TO REPORT IMMEDIATELY:   Following lower endoscopy (colonoscopy or flexible sigmoidoscopy):  Excessive amounts of blood in the stool  Significant tenderness or worsening of abdominal pains  Swelling of the abdomen that is new, acute  Fever of 100F or higher  For urgent or emergent issues, a gastroenterologist can be reached at any hour by calling [phone]. Do not use MyChart messaging for urgent concerns.    DIET:  We do recommend a small meal at first, but then you may proceed to your regular diet.  Drink plenty of fluids but you should avoid alcoholic  beverages for 24 hours.  ACTIVITY:  You should plan to take it easy for the rest of today and you should NOT DRIVE or use heavy machinery until tomorrow (because of the sedation medicines used during the test).    FOLLOW UP: Our staff will call the number listed on your records 48-72 hours following your procedure to check on you and address any questions or concerns that you may have regarding the information given to you following your procedure. If we do not reach you, we will leave a message.  We will attempt to reach you two times.  During this call, we will ask if you have developed any symptoms of COVID 19. If you develop any symptoms (ie: fever, flu-like symptoms, shortness of breath, cough etc.) before then, please call [phone].  If you test positive for Covid 19 in the 2 weeks post procedure, please call and report this information to [place].    If any biopsies were taken you will be contacted by phone or by letter within the next 1-3 weeks.  Please call us at [phone] if you have not heard about the biopsies in 3 weeks.    SIGNATURES/CONFIDENTIALITY: You and/or your care partner have signed paperwork which will be entered into your electronic medical record.  These signatures attest to the fact that that the information above on your After Visit Summary has been reviewed and is understood.  Full responsibility of the confidentiality of this discharge information lies with  you and/or your care-partner.

## 2020-05-22 NOTE — Progress Notes (Signed)
Pt's states no medical or surgical changes since previsit or office visit. 

## 2020-05-22 NOTE — Op Note (Addendum)
[facility] Patient Name: Julie Grimes Procedure Date: 05/22/2020 10:39 AM MRN: [id number] Endoscopist: Mauri Pole , MD Age: 60 Referring MD:  Date of Birth: 1960/06/12 Gender: Female Account #: [id number] Procedure:                Colonoscopy Indications:              High risk colon cancer surveillance: Personal                            history of colonic polyps, High risk colon cancer                            surveillance: Personal history of adenoma (10 mm or                            greater in size) Medicines:                Monitored Anesthesia Care Procedure:                Pre-Anesthesia Assessment:                           - Prior to the procedure, a History and Physical                            was performed, and patient medications and                            allergies were reviewed. The patient's tolerance of                            previous anesthesia was also reviewed. The risks                            and benefits of the procedure and the sedation                            options and risks were discussed with the patient.                            All questions were answered, and informed consent                            was obtained. Prior Anticoagulants: The patient has                            taken no previous anticoagulant or antiplatelet                            agents. ASA Grade Assessment: III - A patient with                            severe systemic disease. After reviewing the risks  and benefits, the patient was deemed in                            satisfactory condition to undergo the procedure.                           After obtaining informed consent, the colonoscope                            was passed under direct vision. Throughout the                            procedure, the patient's blood pressure, pulse, and                            oxygen saturations were monitored  continuously. The                            Olympus PCF-[id number] (#[id number]) Colonoscope was                            introduced through the anus and advanced to the the                            cecum, identified by appendiceal orifice and                            ileocecal valve. The colonoscopy was performed                            without difficulty. The patient tolerated the                            procedure well. The quality of the bowel                            preparation was adequate. The ileocecal valve,                            appendiceal orifice, and rectum were photographed. Scope In: 10:59:06 AM Scope Out: 11:12:09 AM Scope Withdrawal Time: 0 hours 10 minutes 51 seconds  Total Procedure Duration: 0 hours 13 minutes 3 seconds  Findings:                 The perianal and digital rectal examinations were                            normal.                           A few small-mouthed diverticula were found in the                            sigmoid colon.  A less than 1 mm polyp was found in the rectum. The                            polyp was sessile. The polyp was removed with a                            cold biopsy forceps. Resection and retrieval were                            complete.                           Non-bleeding internal hemorrhoids were found during                            retroflexion. The hemorrhoids were medium-sized. Complications:            No immediate complications. Estimated Blood Loss:     Estimated blood loss was minimal. Impression:               - Diverticulosis in the sigmoid colon.                           - One less than 1 mm polyp in the rectum, removed                            with a cold biopsy forceps. Resected and retrieved.                           - Non-bleeding internal hemorrhoids. Recommendation:           - Patient has a contact number available for                             emergencies. The signs and symptoms of potential                            delayed complications were discussed with the                            patient. Return to normal activities tomorrow.                            Written discharge instructions were provided to the                            patient.                           - Resume previous diet.                           - Continue present medications.                           - Await pathology results.                           -  Repeat colonoscopy in 5 years for surveillance                            based on pathology results. Mauri Pole, MD 05/22/2020 11:15:51 AM This report has been signed electronically.

## 2020-05-22 NOTE — Progress Notes (Signed)
Called to room to assist during endoscopic procedure.  Patient ID and intended procedure confirmed with present staff. Received instructions for my participation in the procedure from the performing physician.  

## 2020-05-22 NOTE — Progress Notes (Signed)
To PACU, VSS. Report to [email or web address] 

## 2020-05-24 ENCOUNTER — Telehealth: Payer: Self-pay | Admitting: *Deleted

## 2020-05-24 ENCOUNTER — Telehealth: Payer: Self-pay

## 2020-05-24 NOTE — Telephone Encounter (Signed)
  Follow up Call-  Call back number 05/22/2020  Post procedure Call Back phone  # [phone]  Permission to leave phone message Yes  Some recent data might be hidden     Left message

## 2020-05-24 NOTE — Telephone Encounter (Signed)
  Follow up Call-  Call back number 05/22/2020  Post procedure Call Back phone  # [phone]  Permission to leave phone message Yes  Some recent data might be hidden     Patient questions:  Do you have a fever, pain , or abdominal swelling? No. Pain Score  0 *  Have you tolerated food without any problems? Yes.    Have you been able to return to your normal activities? Yes.    Do you have any questions about your discharge instructions: Diet   No. Medications  No. Follow up visit  No.  Do you have questions or concerns about your Care? No.  Actions: * If pain score is 4 or above: No action needed, pain <4.  1. Have you developed a fever since your procedure? no  2.   Have you had an respiratory symptoms (SOB or cough) since your procedure? no  3.   Have you tested positive for COVID 19 since your procedure no  4.   Have you had any family members/close contacts diagnosed with the COVID 19 since your procedure?  no   If yes to any of these questions please route to Joylene John, RN and Joella Prince, RN

## 2020-06-08 ENCOUNTER — Encounter: Payer: Self-pay | Admitting: Gastroenterology

## 2020-06-12 DIAGNOSIS — Z23 Encounter for immunization: Secondary | ICD-10-CM | POA: Diagnosis not present

## 2020-07-02 DIAGNOSIS — G4733 Obstructive sleep apnea (adult) (pediatric): Secondary | ICD-10-CM | POA: Diagnosis not present

## 2020-10-03 DIAGNOSIS — G4733 Obstructive sleep apnea (adult) (pediatric): Secondary | ICD-10-CM | POA: Diagnosis not present

## 2021-01-02 DIAGNOSIS — G4733 Obstructive sleep apnea (adult) (pediatric): Secondary | ICD-10-CM | POA: Diagnosis not present

## 2021-03-13 DIAGNOSIS — Z0000 Encounter for general adult medical examination without abnormal findings: Secondary | ICD-10-CM | POA: Diagnosis not present

## 2021-03-18 DIAGNOSIS — F329 Major depressive disorder, single episode, unspecified: Secondary | ICD-10-CM | POA: Diagnosis not present

## 2021-03-18 DIAGNOSIS — R7301 Impaired fasting glucose: Secondary | ICD-10-CM | POA: Diagnosis not present

## 2021-03-18 DIAGNOSIS — N1831 Chronic kidney disease, stage 3a: Secondary | ICD-10-CM | POA: Diagnosis not present

## 2021-03-18 DIAGNOSIS — E7800 Pure hypercholesterolemia, unspecified: Secondary | ICD-10-CM | POA: Diagnosis not present

## 2021-03-18 DIAGNOSIS — Z0000 Encounter for general adult medical examination without abnormal findings: Secondary | ICD-10-CM | POA: Diagnosis not present

## 2021-03-19 ENCOUNTER — Other Ambulatory Visit (HOSPITAL_COMMUNITY): Payer: Self-pay

## 2021-03-19 MED ORDER — WEGOVY 0.25 MG/0.5ML ~~LOC~~ SOAJ
SUBCUTANEOUS | 1 refills | Status: AC
  Filled 2021-03-19 – 2021-03-20 (×2): qty 2, 28d supply, fill #0

## 2021-03-20 ENCOUNTER — Other Ambulatory Visit (HOSPITAL_COMMUNITY): Payer: Self-pay

## 2021-03-25 ENCOUNTER — Encounter: Payer: Self-pay | Admitting: Primary Care

## 2021-03-25 ENCOUNTER — Telehealth: Payer: Self-pay | Admitting: Primary Care

## 2021-03-25 ENCOUNTER — Other Ambulatory Visit: Payer: Self-pay

## 2021-03-25 ENCOUNTER — Ambulatory Visit (INDEPENDENT_AMBULATORY_CARE_PROVIDER_SITE_OTHER): Payer: BC Managed Care – PPO | Admitting: Primary Care

## 2021-03-25 DIAGNOSIS — G4733 Obstructive sleep apnea (adult) (pediatric): Secondary | ICD-10-CM

## 2021-03-25 DIAGNOSIS — Z9989 Dependence on other enabling machines and devices: Secondary | ICD-10-CM

## 2021-03-25 NOTE — Telephone Encounter (Signed)
I need CPAP compliance report and copy of sleep study from [facility] for this patient. Then we can place an order for new CPAP and change DME companies.  ?

## 2021-03-25 NOTE — Assessment & Plan Note (Signed)
-   Establishing as new patient for OSA management. Previously managed by Dr. Shelia Media. She reports compliance with CPAP and benefit from use. No residual daytime sleepiness. Epworth 1. No issues with pressure setting or mask. Motor life exceeded, needs new machine. We will need to get a copy of her most recent sleep study and compliance report from [facility]. Once we have received these we can place an order for new CPAP machine and change DME companies.  ?

## 2021-03-25 NOTE — Patient Instructions (Addendum)
We will need to get a copy of CPAP compliance report and copy of sleep study from [facility]. Once we have these we can then place an order for new CPAP and change DME companies.  ? ?Follow-up: ?3 months with Beth NP or sooner if needed  ? ?CPAP and BIPAP Information ?CPAP and BIPAP are methods that use air pressure to keep your airways open and to help you breathe well. CPAP and BIPAP use different amounts of pressure. Your health care provider will tell you whether CPAP or BIPAP would be more helpful for you. ?CPAP stands for "continuous positive airway pressure." With CPAP, the amount of pressure stays the same while you breathe in (inhale) and out (exhale). ?BIPAP stands for "bi-level positive airway pressure." With BIPAP, the amount of pressure will be higher when you inhale and lower when you exhale. This allows you to take larger breaths. ?CPAP or BIPAP may be used in the hospital, or your health care provider may want you to use it at home. You may need to have a sleep study before your health care provider can order a machine for you to use at home. ?What are the advantages? ?CPAP or BIPAP can be helpful if you have: ?Sleep apnea. ?Chronic obstructive pulmonary disease (COPD). ?Heart failure. ?Medical conditions that cause muscle weakness, including muscular dystrophy or amyotrophic lateral sclerosis (ALS). ?Other problems that cause breathing to be shallow, weak, abnormal, or difficult. ?CPAP and BIPAP are most commonly used for obstructive sleep apnea (OSA) to keep the airways from collapsing when the muscles relax during sleep. ?What are the risks? ?Generally, this is a safe treatment. However, problems may occur, including: ?Irritated skin or skin sores if the mask does not fit properly. ?Dry or stuffy nose or nosebleeds. ?Dry mouth. ?Feeling gassy or bloated. ?Sinus or lung infection if the equipment is not cleaned properly. ?When should CPAP or BIPAP be used? ?In most cases, the mask only needs to be  worn during sleep. Generally, the mask needs to be worn throughout the night and during any daytime naps. People with certain medical conditions may also need to wear the mask at other times, such as when they are awake. Follow instructions from your health care provider about when to use the machine. ?What happens during CPAP or BIPAP? ?Both CPAP and BIPAP are provided by a small machine with a flexible plastic tube that attaches to a plastic mask that you wear. Air is blown through the mask into your nose or mouth. The amount of pressure that is used to blow the air can be adjusted on the machine. Your health care provider will set the pressure setting and help you find the best mask for you. ?Tips for using the mask ?Because the mask needs to be snug, some people feel trapped or closed-in (claustrophobic) when first using the mask. If you feel this way, you may need to get used to the mask. One way to do this is to hold the mask loosely over your nose or mouth and then gradually apply the mask more snugly. You can also gradually increase the amount of time that you use the mask. ?Masks are available in various types and sizes. If your mask does not fit well, talk with your health care provider about getting a different one. Some common types of masks include: ?Full face masks, which fit over the mouth and nose. ?Nasal masks, which fit over the nose. ?Nasal pillow or prong masks, which fit into the nostrils. ?  If you are using a mask that fits over your nose and you tend to breathe through your mouth, a chin strap may be applied to help keep your mouth closed. ?Use a skin barrier to protect your skin as told by your health care provider. ?Some CPAP and BIPAP machines have alarms that may sound if the mask comes off or develops a leak. ?If you have trouble with the mask, it is very important that you talk with your health care provider about finding a way to make the mask easier to tolerate. Do not stop using the  mask. There could be a negative impact on your health if you stop using the mask. ?Tips for using the machine ?Place your CPAP or BIPAP machine on a secure table or stand near an electrical outlet. ?Know where the on/off switch is on the machine. ?Follow instructions from your health care provider about how to set the pressure on your machine and when you should use it. ?Do not eat or drink while the CPAP or BIPAP machine is on. Food or fluids could get pushed into your lungs by the pressure of the CPAP or BIPAP. ?For home use, CPAP and BIPAP machines can be rented or purchased through home health care companies. Many different brands of machines are available. Renting a machine before purchasing may help you find out which particular machine works well for you. Your health insurance company may also decide which machine you may get. ?Keep the CPAP or BIPAP machine and attachments clean. Ask your health care provider for specific instructions. ?Check the humidifier if you have a dry stuffy nose or nosebleeds. Make sure it is working correctly. ?Follow these instructions at home: ?Take over-the-counter and prescription medicines only as told by your health care provider. Ask if you can take sinus medicine if your sinuses are blocked. ?Do not use any products that contain nicotine or tobacco. These products include cigarettes, chewing tobacco, and vaping devices, such as e-cigarettes. If you need help quitting, ask your health care provider. ?Keep all follow-up visits. This is important. ?Contact a health care provider if: ?You have redness or pressure sores on your head, face, mouth, or nose from the mask or head gear. ?You have trouble using the CPAP or BIPAP machine. ?You cannot tolerate wearing the CPAP or BIPAP mask. ?Someone tells you that you snore even when wearing your CPAP or BIPAP. ?Get help right away if: ?You have trouble breathing. ?You feel confused. ?Summary ?CPAP and BIPAP are methods that use air  pressure to keep your airways open and to help you breathe well. ?If you have trouble with the mask, it is very important that you talk with your health care provider about finding a way to make the mask easier to tolerate. Do not stop using the mask. There could be a negative impact to your health if you stop using the mask. ?Follow instructions from your health care provider about when to use the machine. ?This information is not intended to replace advice given to you by your health care provider. Make sure you discuss any questions you have with your health care provider. ?Document Revised: 08/15/2020 Document Reviewed: 12/16/2019 ?Elsevier Patient Education ? [place]. ? ?

## 2021-03-25 NOTE — Progress Notes (Addendum)
? ?'@Patient'$  ID: Julie Grimes, female    DOB: [birth year], 61 y.o.   MRN: [id number] ? ?Chief Complaint  ?Patient presents with  ? Consult  ?  Pt states she needs new CPAP machine. Pt states she has had a sleep study many years ago.   ? ? ?Referring provider: ?Deland Pretty, MD ? ?HPI: ?61 year old female, never. PMH significant for OSA, asthma and allergic rhinitis.  ? ?03/25/2021 ?Patient presents today to establish as new patient for OSA. She has hx sleep apnea, current on CPAP and needs new CPAP machine. She is unsure location and date of sleep study, believed it was done in [place]. Dr. Shelia Media was previously managing her sleep apnea. States that she received a message on her CAPP that the motor life had been exceeded. She at times is suffocating d/t hot air the last couple of months. Currently using resmed airsense 10.  No issues with pressure setting or masking fit. She uses nasal pillow mask. She reports compliance with CPAP every night average 8-9 hours. Denies daytime sleepiness or fatigue. Her current DME company id [employer], she would like to change companies. She does not need new supplies.  ? ?Sleep questionnaire ?Symptoms- Hx OSA   ?Prior sleep study- Yes, patient is unsure date. No record in chart. DME [employer].  ?Bedtime- 11pm ?Time to fall asleep- 20-56mns ?Nocturnal awakenings- twice ?Out of bed/start of day- 8am ?Weight changes- Up, unknown ?Do you operate heavy machinery- No ?Do you currently wear CPAP- Yes ?Do you current wear oxygen- No ?Epworth- 1 ? ? ?Allergies  ?Allergen Reactions  ? Stadol [Butorphanol] Other (See Comments)  ?  hypotension  ? [place] Venom   ? ? ? ?There is no immunization history on file for this patient. ? ?Past Medical History:  ?Diagnosis Date  ? Allergy   ? Arthritis   ? osteoarthritis  ? Asthma   ? reactive  ? GERD (gastroesophageal reflux disease)   ? minimal  ? Sleep apnea   ? cpap nightly  ? ? ?Tobacco History: ?Social History  ? ?Tobacco Use  ?Smoking Status  Never  ?Smokeless Tobacco Never  ? ?Counseling given: Not Answered ? ? ?Outpatient Medications Prior to Visit  ?Medication Sig Dispense Refill  ? albuterol (VENTOLIN HFA) 108 (90 Base) MCG/ACT inhaler Inhale 2 puffs into the lungs every 4 (four) hours as needed.    ? buPROPion (WELLBUTRIN XL) 300 MG 24 hr tablet Take 1 tablet by mouth daily.    ? Cholecalciferol 50 MCG (2000 UT) CAPS 4 to 5 capsules    ? mirabegron ER (MYRBETRIQ) 50 MG TB24 tablet Take by mouth 2 (two) times daily.     ? Naltrexone-buPROPion HCl ER 8-90 MG TB12 Take by mouth.    ? naproxen sodium (ANAPROX) 220 MG tablet Take 220 mg by mouth as needed.    ? Omega 3 1000 MG CAPS 1 capsule    ? Semaglutide-Weight Management (WEGOVY) 0.25 MG/0.5ML SOAJ Inject 1 pen (0.5 MLs) into the skin once a week 2 mL 1  ? vitamin B-12 (CYANOCOBALAMIN) 1000 MCG tablet 1 tablet    ? ?No facility-administered medications prior to visit.  ? ? ?Review of Systems ? ?Review of Systems  ?Constitutional: Negative.  Negative for fatigue.  ?HENT: Negative.    ?Respiratory: Negative.    ?Cardiovascular: Negative.   ? ? ?Physical Exam ? ?BP 128/76 (BP Location: Left Arm, Patient Position: Sitting, Cuff Size: Normal)   Pulse 97   Temp  98.6 ?F (37 ?C) (Oral)   Ht '5\' 5"'$  (1.651 m)   Wt 261 lb (118.4 kg)   LMP 06/15/2018   SpO2 96%   BMI 43.43 kg/m?  ?Physical Exam ?Constitutional:   ?   Appearance: Normal appearance.  ?HENT:  ?   Head: Normocephalic and atraumatic.  ?   Mouth/Throat:  ?   Mouth: Mucous membranes are moist.  ?   Pharynx: Oropharynx is clear.  ?Cardiovascular:  ?   Rate and Rhythm: Normal rate and regular rhythm.  ?Pulmonary:  ?   Effort: Pulmonary effort is normal.  ?   Breath sounds: Normal breath sounds. No rhonchi or rales.  ?   Comments: Isolated wheeze left upper lobe, cleared with deep breath ?Musculoskeletal:     ?   General: Normal range of motion.  ?   Cervical back: Normal range of motion and neck supple.  ?Skin: ?   General: Skin is warm and dry.   ?Neurological:  ?   General: No focal deficit present.  ?   Mental Status: She is alert and oriented to person, place, and time. Mental status is at baseline.  ?Psychiatric:     ?   Mood and Affect: Mood normal.     ?   Behavior: Behavior normal.     ?   Thought Content: Thought content normal.     ?   Judgment: Judgment normal.  ?  ? ?Lab Results: ? ?CBC ?No results found for: WBC, RBC, HGB, HCT, PLT, MCV, MCH, MCHC, RDW, LYMPHSABS, MONOABS, EOSABS, BASOSABS ? ?BMET ?No results found for: NA, K, CL, CO2, GLUCOSE, BUN, CREATININE, CALCIUM, GFRNONAA, GFRAA ? ?BNP ?No results found for: BNP ? ?ProBNP ?No results found for: PROBNP ? ?Imaging: ?No results found. ? ? ?Assessment & Plan:  ? ?Obstructive sleep apnea on CPAP ?- Establishing as new patient for OSA management. Previously managed by Dr. Shelia Media. She reports compliance with CPAP and benefit from use. No residual daytime sleepiness. Epworth 1. No issues with pressure setting or mask. Motor life exceeded, needs new machine. We will need to get a copy of her most recent sleep study and compliance report from [facility]. Once we have received these we can place an order for new CPAP machine and change DME companies.  ? ?03/25/2021 Addendum:  ?Compliance report 03/27/20-03/26/21  ?Usage 5/265 days (1%) ?Pressure 8cm h20 ?Air leaks 2.4L/min (95%) ?AHI 1.4  ? ?Martyn Ehrich, NP ?03/25/2021 ? ?

## 2021-03-26 NOTE — Progress Notes (Signed)
Reviewed and agree with assessment/plan. ? ? ?Chesley Mires, MD ?[place] ?03/26/2021, 8:56 AM ?Pager:  [phone] ? ?

## 2021-03-27 NOTE — Telephone Encounter (Signed)
Called [facility] and they are faxing the sleep study from 2017 to [facility]. Julie Grimes received the compliance report from her cpap machine already. Nothing further needed at this time  ?

## 2021-04-02 DIAGNOSIS — G4733 Obstructive sleep apnea (adult) (pediatric): Secondary | ICD-10-CM | POA: Diagnosis not present

## 2021-06-24 ENCOUNTER — Telehealth: Payer: Self-pay | Admitting: Primary Care

## 2021-06-24 DIAGNOSIS — G4733 Obstructive sleep apnea (adult) (pediatric): Secondary | ICD-10-CM

## 2021-06-25 NOTE — Telephone Encounter (Signed)
Beth did you ever receive the sleep study and the cpap compliance report for this patient.   If so can we place an order for a new cpap machine for this patient.   If not I will call [employer] once again and get you that sleep study and compliance report.   Thank you

## 2021-06-26 NOTE — Telephone Encounter (Signed)
No I have not, just looked through my paper and did not find it

## 2021-07-01 DIAGNOSIS — G4733 Obstructive sleep apnea (adult) (pediatric): Secondary | ICD-10-CM | POA: Diagnosis not present

## 2021-07-01 NOTE — Telephone Encounter (Signed)
Patient checking on new order for CPAP machine. Patient phone number is [phone].

## 2021-07-02 NOTE — Telephone Encounter (Signed)
Called and spoke with patient. She verbalized understanding. Order has been placed for new cpap machine.   Nothing further needed at time of call.

## 2021-07-02 NOTE — Telephone Encounter (Signed)
No cpap has been ordered per her chart.   Beth, can you please advise? The sleep study was sent to scan on 04/15/21 and is available in her chart.

## 2021-07-02 NOTE — Telephone Encounter (Signed)
I got compliance report. As long as we have received old sleep study we can place an order for new machine.  CPAP pressure is 8cm h20.

## 2021-07-25 ENCOUNTER — Telehealth: Payer: Self-pay | Admitting: Primary Care

## 2021-07-25 NOTE — Telephone Encounter (Signed)
Pt is NOT happy. States she has still not received any word on her CPAP and has placed multiple calls since March regarding this. On 6/13, order was sent to [place]. Pt DOES NOT want to work with [facility] and has stated this previously. Routing as urgent to help pt and alleviate situation.

## 2021-07-25 NOTE — Telephone Encounter (Signed)
Called and spoke with patient, she does not want to use [facility] for her CPAP going forward.  Advised that it appears we had trouble getting her sleep study and compliance records from [facility] and that was the delay with getting the new CPAP ordered back in March of 2023.  She stated if she absolutely has to go with [facility], she will, but she would prefer to use another DME company.  Advised I would talk with one of our PCCs and see what we can do and then we will call her back to let her know the status.  She verbalized understanding.  Estill Bamberg, please advise.  Thank you.

## 2021-07-25 NOTE — Telephone Encounter (Signed)
I sent [facility] a message to see if they would cancel the order we sent them. As soon as I get confirmation back I will resend the order to [place]

## 2021-07-29 NOTE — Telephone Encounter (Signed)
[facility] canceled the order they had and I faxed the referral to [place]. Nothing further needed at this time.

## 2021-08-06 DIAGNOSIS — G4733 Obstructive sleep apnea (adult) (pediatric): Secondary | ICD-10-CM | POA: Diagnosis not present

## 2021-08-28 ENCOUNTER — Ambulatory Visit: Payer: BC Managed Care – PPO | Admitting: Primary Care

## 2021-08-28 ENCOUNTER — Encounter: Payer: Self-pay | Admitting: Primary Care

## 2021-08-28 VITALS — BP 124/70 | HR 97 | Ht 64.0 in | Wt 260.0 lb

## 2021-08-28 DIAGNOSIS — Z9989 Dependence on other enabling machines and devices: Secondary | ICD-10-CM

## 2021-08-28 DIAGNOSIS — G4733 Obstructive sleep apnea (adult) (pediatric): Secondary | ICD-10-CM | POA: Diagnosis not present

## 2021-08-28 NOTE — Patient Instructions (Addendum)
-   No pressure setting changes needed today - Continue to wear CPAP every night for min 4-6 hours or longer - Do not drive if experiencing excessive daytime sleepiness or fatigue  - I would contact  [place] about issues with download, we were only able to get data from 07/20/2021 - 08/19/2018.  Recommend using SD card as a backup for compliance check, please bring this with you to your follow-up.  Order: DME company to provide patient with SD card as back up for compliance   Follow-up: 2-3 months in office for follow-up compliance check (please bring SD card in with you to visit)

## 2021-08-28 NOTE — Assessment & Plan Note (Addendum)
-   Received new CPAP machine in July 2023 from [place]. No issues with mask fit or pressure setting. Reports benefit in sleep quality and energy level with use. Machine is working well except that she has been having issues with machine not registering compliance. She has been wearing CPAP every night since receiving. Airvirew download showed that she is 43% compliant with use in the last 30 days. The machine has not registered usage between 7/30-8/9. Current pressure setting 8cm h20 with residual AHI 1.6/hour. We will place an order for her to be provided SD card for back up.  Advise she continue to wear CPAP every night for minimum 4 to 6 hours.  Advised against driving if experiencing excessive daytime sleepiness or fatigue.  Contact DME company when she needs supplies renewed.  Needs follow-up in 2-3 months for compliance, then annually.

## 2021-08-28 NOTE — Progress Notes (Signed)
$'@Patient'C$  ID: Julie Grimes, female    DOB: [birth year], 61 y.o.   MRN: [id number]  Chief Complaint  Patient presents with   Follow-up    Pt f/u on OSA, cpap is going well. She is upset because Airview is not registering. Reports getting 8hrs/night.      Referring provider: Deland Pretty, MD  HPI: 61 year old female, never. PMH significant for OSA, asthma and allergic rhinitis.   Previous [facility] pulmonary encounter: 03/25/2021 Patient presents today to establish as new patient for OSA. She has hx sleep apnea, current on CPAP and needs new CPAP machine. She is unsure location and date of sleep study, believed it was done in [place]. Dr. Shelia Media was previously managing her sleep apnea. States that she received a message on her CAPP that the motor life had been exceeded. She at times is suffocating d/t hot air the last couple of months. Currently using resmed airsense 10.  No issues with pressure setting or masking fit. She uses nasal pillow mask. She reports compliance with CPAP every night average 8-9 hours. Denies daytime sleepiness or fatigue. Her current DME company id [facility], she would like to change companies. She does not need new supplies.   Sleep questionnaire Symptoms- Hx OSA   Prior sleep study- Yes, patient is unsure date. No record in chart. DME [facility].  Bedtime- 11pm Time to fall asleep- 20-65mns Nocturnal awakenings- twice Out of bed/start of day- 8am Weight changes- Up, unknown Do you operate heavy machinery- No Do you currently wear CPAP- Yes Do you current wear oxygen- No [facility]- 1  08/28/2021- interim hx  Patient presents today for CPAP compliance. Since her last visit she received new CPAP machine with [facility]. She is doing well. No issues with mask or pressure setting. Machine is working well except that she has been having issues with machine not registering compliance. She has been wearing CPAP every night since receiving CPAP back in July. Airvirew download  showed that she is 43% compliant with use in the last 30 days. The machine has not registered usage between 7/30-8/9. Current pressure setting 8cm h20 with residual AHI 1.6/hour. She will contact DME company when she needs supplies renewed.   Airview download 07/20/21-08/18/21 Usage 13/30 days (43%); 13 days > 4 hours Average usage days used 9 hours 5 mins Pressure 8cm h20 Airleaks 0.3L/min AHI 1.6    Allergies  Allergen Reactions   Stadol [Butorphanol] Other (See Comments)    hypotension   Yellow Jacket Venom      There is no immunization history on file for this patient.  Past Medical History:  Diagnosis Date   Allergy    Arthritis    osteoarthritis   Asthma    reactive   GERD (gastroesophageal reflux disease)    minimal   Sleep apnea    cpap nightly    Tobacco History: Social History   Tobacco Use  Smoking Status Never  Smokeless Tobacco Never   Counseling given: Not Answered   Outpatient Medications Prior to Visit  Medication Sig Dispense Refill   albuterol (VENTOLIN HFA) 108 (90 Base) MCG/ACT inhaler Inhale 2 puffs into the lungs every 4 (four) hours as needed.     buPROPion (WELLBUTRIN XL) 300 MG 24 hr tablet Take 1 tablet by mouth daily.     Cholecalciferol 50 MCG (2000 UT) CAPS 4 to 5 capsules     mirabegron ER (MYRBETRIQ) 50 MG TB24 tablet Take by mouth 2 (two) times daily.  Naltrexone-buPROPion HCl ER 8-90 MG TB12 Take by mouth.     naproxen sodium (ANAPROX) 220 MG tablet Take 220 mg by mouth as needed.     Omega 3 1000 MG CAPS 1 capsule     Semaglutide-Weight Management (WEGOVY) 0.25 MG/0.5ML SOAJ Inject 1 pen (0.5 MLs) into the skin once a week 2 mL 1   vitamin B-12 (CYANOCOBALAMIN) 1000 MCG tablet 1 tablet     No facility-administered medications prior to visit.   Review of Systems  Review of Systems  Constitutional: Negative.   HENT: Negative.    Respiratory: Negative.    Cardiovascular: Negative.      Physical Exam  BP 124/70 (BP  Location: Right Arm, Patient Position: Sitting, Cuff Size: Large)   Pulse 97   Ht '5\' 4"'$  (1.626 m)   Wt 260 lb (117.9 kg)   LMP 06/15/2018   SpO2 95%   BMI 44.63 kg/m  Physical Exam Constitutional:      Appearance: Normal appearance.  Cardiovascular:     Rate and Rhythm: Normal rate and regular rhythm.  Pulmonary:     Effort: Pulmonary effort is normal.     Breath sounds: Normal breath sounds.  Skin:    General: Skin is warm and dry.  Neurological:     General: No focal deficit present.     Mental Status: She is alert and oriented to person, place, and time. Mental status is at baseline.  Psychiatric:        Mood and Affect: Mood normal.        Behavior: Behavior normal.        Thought Content: Thought content normal.        Judgment: Judgment normal.      Lab Results:  CBC No results found for: "WBC", "RBC", "HGB", "HCT", "PLT", "MCV", "MCH", "MCHC", "RDW", "LYMPHSABS", "MONOABS", "EOSABS", "BASOSABS"  BMET No results found for: "NA", "K", "CL", "CO2", "GLUCOSE", "BUN", "CREATININE", "CALCIUM", "GFRNONAA", "GFRAA"  BNP No results found for: "BNP"  ProBNP No results found for: "PROBNP"  Imaging: No results found.   Assessment & Plan:   Obstructive sleep apnea on CPAP - Received new CPAP machine in July 2023 from [facility]. No issues with mask fit or pressure setting. Reports benefit in sleep quality and energy level with use. Machine is working well except that she has been having issues with machine not registering compliance. She has been wearing CPAP every night since receiving. Airvirew download showed that she is 43% compliant with use in the last 30 days. The machine has not registered usage between 7/30-8/9. Current pressure setting 8cm h20 with residual AHI 1.6/hour. We will place an order for her to be provided SD card for back up.  Advise she continue to wear CPAP every night for minimum 4 to 6 hours.  Advised against driving if experiencing excessive daytime  sleepiness or fatigue.  Contact DME company when she needs supplies renewed.  Needs follow-up in 2-3 months for compliance, then annually.      Martyn Ehrich, NP 08/28/2021

## 2021-08-28 NOTE — Progress Notes (Signed)
Reviewed and agree with assessment/plan.   Chesley Mires, MD [facility] Pulmonary/Critical Care 08/28/2021, 2:45 PM Pager:  [phone]

## 2021-09-04 ENCOUNTER — Other Ambulatory Visit: Payer: Self-pay

## 2021-09-06 DIAGNOSIS — G4733 Obstructive sleep apnea (adult) (pediatric): Secondary | ICD-10-CM | POA: Diagnosis not present

## 2021-10-07 DIAGNOSIS — G4733 Obstructive sleep apnea (adult) (pediatric): Secondary | ICD-10-CM | POA: Diagnosis not present

## 2021-10-08 ENCOUNTER — Telehealth: Payer: Self-pay | Admitting: Primary Care

## 2021-10-09 NOTE — Telephone Encounter (Signed)
Julie Grimes, please advise if pt's next OV with you is okay to be a virtual visit or if it needs to be in person.

## 2021-10-09 NOTE — Telephone Encounter (Signed)
Called and spoke with pt letting her know that BW said her appt could be virtual if we could get a download before the visit. Pt said she is able to see data as they fixed the issue with not being able to see the data online. I went on Airview and pt's data is able to be seen on there. Changed pt's appt to mychart visit and pt said she would need to have link sent to mobile number. Nothing further needed.

## 2021-10-09 NOTE — Telephone Encounter (Signed)
Fine to do virtually if we can obtain compliance report before hand

## 2021-10-21 ENCOUNTER — Telehealth (INDEPENDENT_AMBULATORY_CARE_PROVIDER_SITE_OTHER): Payer: BC Managed Care – PPO | Admitting: Primary Care

## 2021-10-21 DIAGNOSIS — G4730 Sleep apnea, unspecified: Secondary | ICD-10-CM | POA: Diagnosis not present

## 2021-10-21 NOTE — Progress Notes (Signed)
Virtual Visit via Video Note  I connected with Julie Grimes on 10/21/21 at  1:30 PM EDT by a video enabled telemedicine application and verified that I am speaking with the correct person using two identifiers.  Location: Patient: Home Provider: Office    I discussed the limitations of evaluation and management by telemedicine and the availability of in person appointments. The patient expressed understanding and agreed to proceed.  History of Present Illness: 61 year old female, never. PMH significant for OSA, asthma and allergic rhinitis.   Previous [facility] pulmonary encounter: 03/25/2021 Patient presents today to establish as new patient for OSA. She has hx sleep apnea, current on CPAP and needs new CPAP machine. She is unsure location and date of sleep study, believed it was done in [place]. Dr. Shelia Media was previously managing her sleep apnea. States that she received a message on her CAPP that the motor life had been exceeded. She at times is suffocating d/t hot air the last couple of months. Currently using resmed airsense 10.  No issues with pressure setting or masking fit. She uses nasal pillow mask. She reports compliance with CPAP every night average 8-9 hours. Denies daytime sleepiness or fatigue. Her current DME company id [facility], she would like to change companies. She does not need new supplies.   Sleep questionnaire Symptoms- Hx OSA   Prior sleep study- Yes, patient is unsure date. No record in chart. DME [facility].  Bedtime- 11pm Time to fall asleep- 20-23mns Nocturnal awakenings- twice Out of bed/start of day- 8am Weight changes- Up, unknown Do you operate heavy machinery- No Do you currently wear CPAP- Yes Do you current wear oxygen- No [facility]worth- 1  08/28/2021 Patient presents today for CPAP compliance. Since her last visit she received new CPAP machine with [facility]. She is doing well. No issues with mask or pressure setting. Machine is working well except that she has  been having issues with machine not registering compliance. She has been wearing CPAP every night since receiving CPAP back in July. Airvirew download showed that she is 43% compliant with use in the last 30 days. The machine has not registered usage between 7/30-8/9. Current pressure setting 8cm h20 with residual AHI 1.6/hour. She will contact DME company when she needs supplies renewed.   Airview download 07/20/21-08/18/21 Usage 13/30 days (43%); 13 days > 4 hours Average usage days used 9 hours 5 mins Pressure 8cm h20 Airleaks 0.3L/min AHI 1.6   10/21/2021- Interim hx  Patient contacted today for virtual video visit/ CPAP compliance. [place] [place] She was last seen in August, during that visit her compliance with CPAP was at 43%. The machine was not registering between 08/18/21-08/28/21. Patient is doing well today without concerns.  She is 100% compliant with CPAP use in the last 30 days.  Average usage 9 hours 45 minutes.  Current pressure settings 8 cm H2O.  No issues with mask fit or pressure settings.  She has no residual daytime sleepiness.  She does wake up at night to care for her dogs. [facility]    Observations/Objective:  Appears well with no acute respiratory symptoms  Assessment and Plan:  OSA: - Original sleep study was in 2017. [place] [facility] [facility]. Patient is 100% compliant with CPAP use in the last 30 days. Pressure 8 cm H2O; Residual AHI 1.1/hr. No changes today. Continue to encourage patient aim to wear CPAP  every night for minimum 4 - 6 hours or longer and work on weight loss efforts. DME company is now [place].    Follow Up Instructions:  - Follow-up in 1 year for OSA/CPAP compliance or sooner if needed    I discussed the assessment and treatment plan with the patient. The patient was provided an opportunity to ask questions and all were answered. The patient agreed with  the plan and demonstrated an understanding of the instructions.   The patient was advised to call back or seek an in-person evaluation if the symptoms worsen or if the condition fails to improve as anticipated.  I provided 20 minutes of non-face-to-face time during this encounter.   Martyn Ehrich, NP

## 2021-10-21 NOTE — Patient Instructions (Signed)
Continue to wear CPAP every night for minimum 4 to 6 hours or longer Make sure you change CPAP supplies every 3 months Work on weight loss as able; Do not drive experiencing excessive daytime sleepiness or fatigue Follow-up in 1 year for CPAP compliance  Sleep Apnea Sleep apnea is a condition in which breathing pauses or becomes shallow during sleep. People with sleep apnea usually snore loudly. They may have times when they gasp and stop breathing for 10 seconds or more during sleep. This may happen many times during the night. Sleep apnea disrupts your sleep and keeps your body from getting the rest that it needs. This condition can increase your risk of certain health problems, including: Heart attack. Stroke. Obesity. Type 2 diabetes. Heart failure. Irregular heartbeat. High blood pressure. The goal of treatment is to help you breathe normally again. What are the causes?  The most common cause of sleep apnea is a collapsed or blocked airway. There are three kinds of sleep apnea: Obstructive sleep apnea. This kind is caused by a blocked or collapsed airway. Central sleep apnea. This kind happens when the part of the brain that controls breathing does not send the correct signals to the muscles that control breathing. Mixed sleep apnea. This is a combination of obstructive and central sleep apnea. What increases the risk? You are more likely to develop this condition if you: Are overweight. Smoke. Have a smaller than normal airway. Are older. Are female. Drink alcohol. Take sedatives or tranquilizers. Have a family history of sleep apnea. Have a tongue or tonsils that are larger than normal. What are the signs or symptoms? Symptoms of this condition include: Trouble staying asleep. Loud snoring. Morning headaches. Waking up gasping. Dry mouth or sore throat in the morning. Daytime sleepiness and tiredness. If you have daytime fatigue because of sleep apnea, you may be more  likely to have: Trouble concentrating. Forgetfulness. Irritability or mood swings. Personality changes. Feelings of depression. Sexual dysfunction. This may include loss of interest if you are female, or erectile dysfunction if you are female. How is this diagnosed? This condition may be diagnosed with: A medical history. A physical exam. A series of tests that are done while you are sleeping (sleep study). These tests are usually done in a sleep lab, but they may also be done at home. How is this treated? Treatment for this condition aims to restore normal breathing and to ease symptoms during sleep. It may involve managing health issues that can affect breathing, such as high blood pressure or obesity. Treatment may include: Sleeping on your side. Using a decongestant if you have nasal congestion. Avoiding the use of depressants, including alcohol, sedatives, and narcotics. Losing weight if you are overweight. Making changes to your diet. Quitting smoking. Using a device to open your airway while you sleep, such as: An oral appliance. This is a custom-made mouthpiece that shifts your lower jaw forward. A continuous positive airway pressure (CPAP) device. This device blows air through a mask when you breathe out (exhale). A nasal expiratory positive airway pressure (EPAP) device. This device has valves that you put into each nostril. A bi-level positive airway pressure (BIPAP) device. This device blows air through a mask when you breathe in (inhale) and breathe out (exhale). Having surgery if other treatments do not work. During surgery, excess tissue is removed to create a wider airway. Follow these instructions at home: Lifestyle Make any lifestyle changes that your health care provider recommends. Eat a healthy, well-balanced  diet. Take steps to lose weight if you are overweight. Avoid using depressants, including alcohol, sedatives, and narcotics. Do not use any products that  contain nicotine or tobacco. These products include cigarettes, chewing tobacco, and vaping devices, such as e-cigarettes. If you need help quitting, ask your health care provider. General instructions Take over-the-counter and prescription medicines only as told by your health care provider. If you were given a device to open your airway while you sleep, use it only as told by your health care provider. If you are having surgery, make sure to tell your health care provider you have sleep apnea. You may need to bring your device with you. Keep all follow-up visits. This is important. Contact a health care provider if: The device that you received to open your airway during sleep is uncomfortable or does not seem to be working. Your symptoms do not improve. Your symptoms get worse. Get help right away if: You develop: Chest pain. Shortness of breath. Discomfort in your back, arms, or stomach. You have: Trouble speaking. Weakness on one side of your body. Drooping in your face. These symptoms may represent a serious problem that is an emergency. Do not wait to see if the symptoms will go away. Get medical help right away. Call your local emergency services (911 in the [place] Do not drive yourself to the hospital. Summary Sleep apnea is a condition in which breathing pauses or becomes shallow during sleep. The most common cause is a collapsed or blocked airway. The goal of treatment is to restore normal breathing and to ease symptoms during sleep. This information is not intended to replace advice given to you by your health care provider. Make sure you discuss any questions you have with your health care provider. Document Revised: 08/15/2020 Document Reviewed: 12/16/2019 Elsevier Patient Education  [place].

## 2021-10-21 NOTE — Progress Notes (Signed)
Reviewed and agree with assessment/plan.   Chesley Mires, MD [facility] Pulmonary/Critical Care 10/21/2021, 1:51 PM Pager:  [phone]

## 2021-10-28 ENCOUNTER — Telehealth: Payer: BC Managed Care – PPO | Admitting: Primary Care

## 2021-11-06 DIAGNOSIS — G4733 Obstructive sleep apnea (adult) (pediatric): Secondary | ICD-10-CM | POA: Diagnosis not present

## 2022-03-06 DIAGNOSIS — G4733 Obstructive sleep apnea (adult) (pediatric): Secondary | ICD-10-CM | POA: Diagnosis not present

## 2022-03-17 DIAGNOSIS — Z0000 Encounter for general adult medical examination without abnormal findings: Secondary | ICD-10-CM | POA: Diagnosis not present

## 2022-03-20 DIAGNOSIS — M170 Bilateral primary osteoarthritis of knee: Secondary | ICD-10-CM | POA: Diagnosis not present

## 2022-03-20 DIAGNOSIS — N393 Stress incontinence (female) (male): Secondary | ICD-10-CM | POA: Diagnosis not present

## 2022-03-20 DIAGNOSIS — Z86010 Personal history of colonic polyps: Secondary | ICD-10-CM | POA: Diagnosis not present

## 2022-03-20 DIAGNOSIS — Z0000 Encounter for general adult medical examination without abnormal findings: Secondary | ICD-10-CM | POA: Diagnosis not present

## 2022-06-30 DIAGNOSIS — H02052 Trichiasis without entropian right lower eyelid: Secondary | ICD-10-CM | POA: Diagnosis not present

## 2022-06-30 DIAGNOSIS — H16141 Punctate keratitis, right eye: Secondary | ICD-10-CM | POA: Diagnosis not present

## 2022-07-07 IMAGING — MG MM DIGITAL SCREENING BILAT W/ TOMO AND CAD
8 series · 8 of 24 positions shown · non-contrast
Comparison: None.

CLINICAL DATA: Screening.

EXAM:
DIGITAL SCREENING BILATERAL MAMMOGRAM WITH TOMOSYNTHESIS AND CAD
TECHNIQUE: Bilateral screening digital craniocaudal and mediolateral oblique
mammograms were obtained. Bilateral screening digital breast
tomosynthesis was performed. The images were evaluated with
computer-aided detection.

[L CC synth-2D]
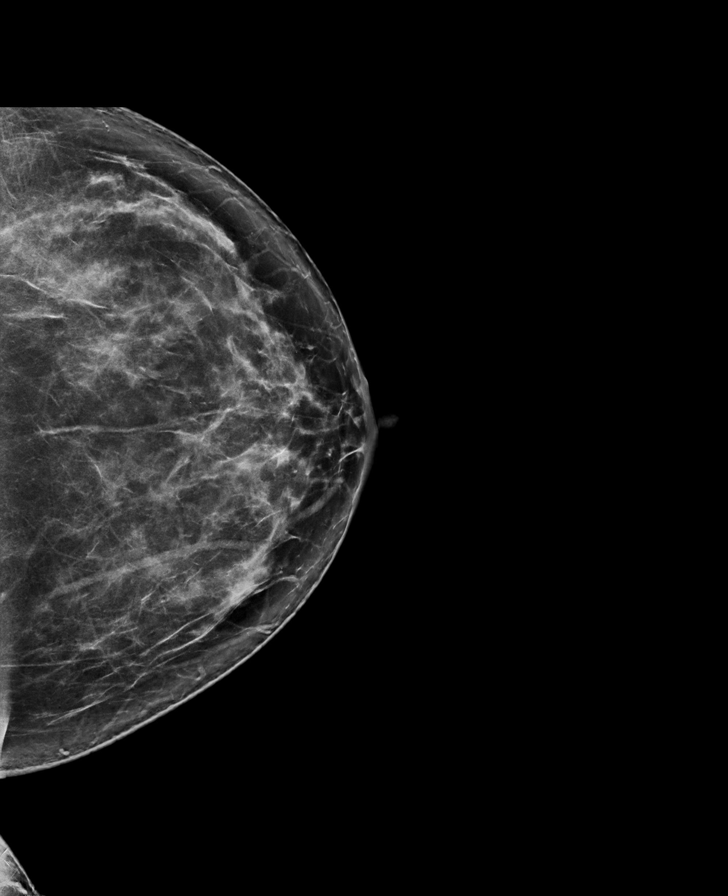

[R CC synth-2D]
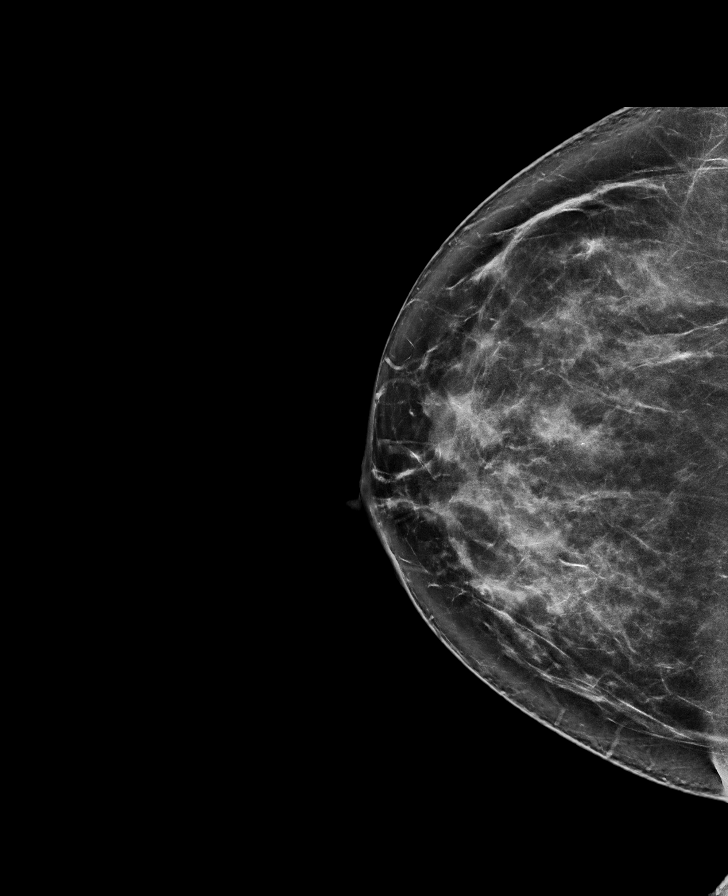

[R MLO synth-2D]
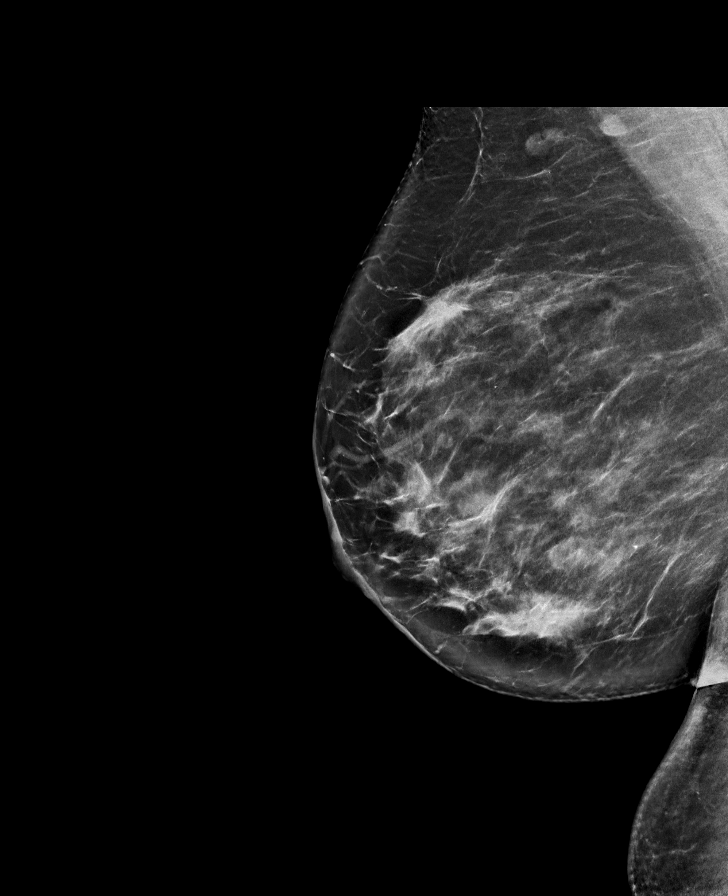

[L MLO synth-2D]
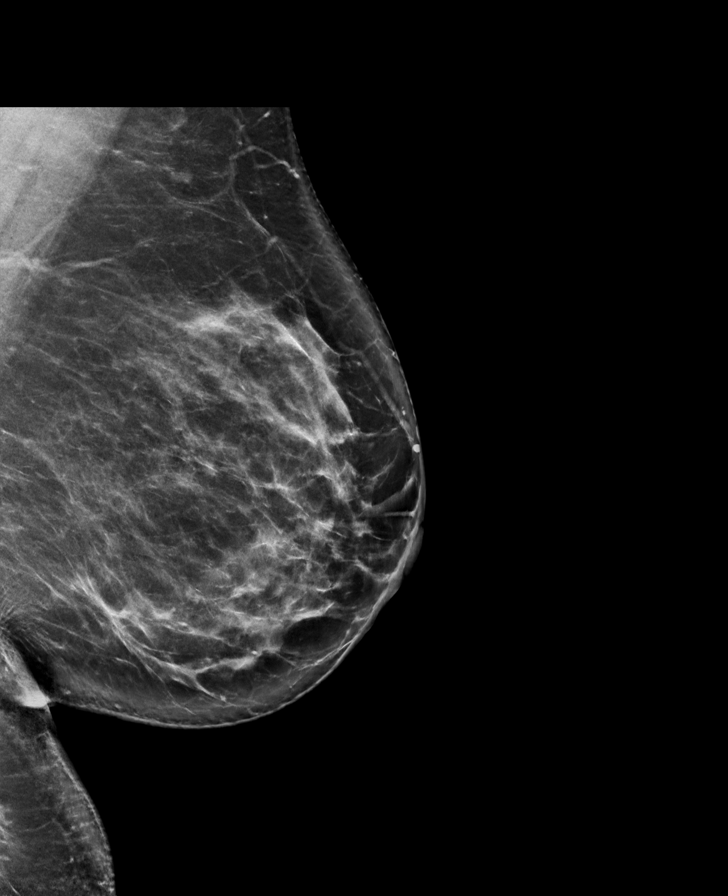

[R CC tomo · tomo slice 45/90.0]
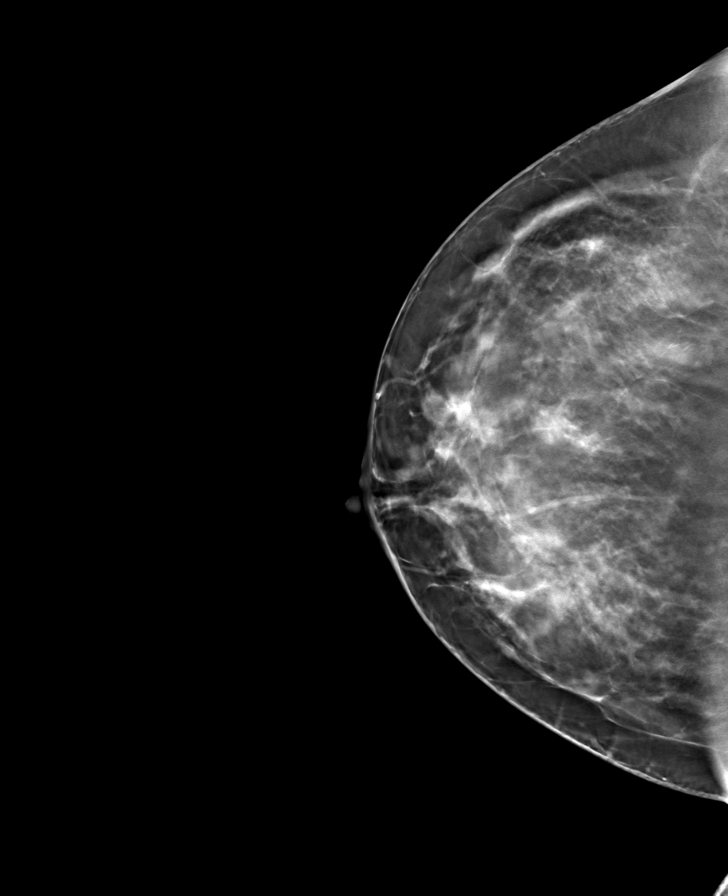

[L MLO tomo · tomo slice 49/97.0]
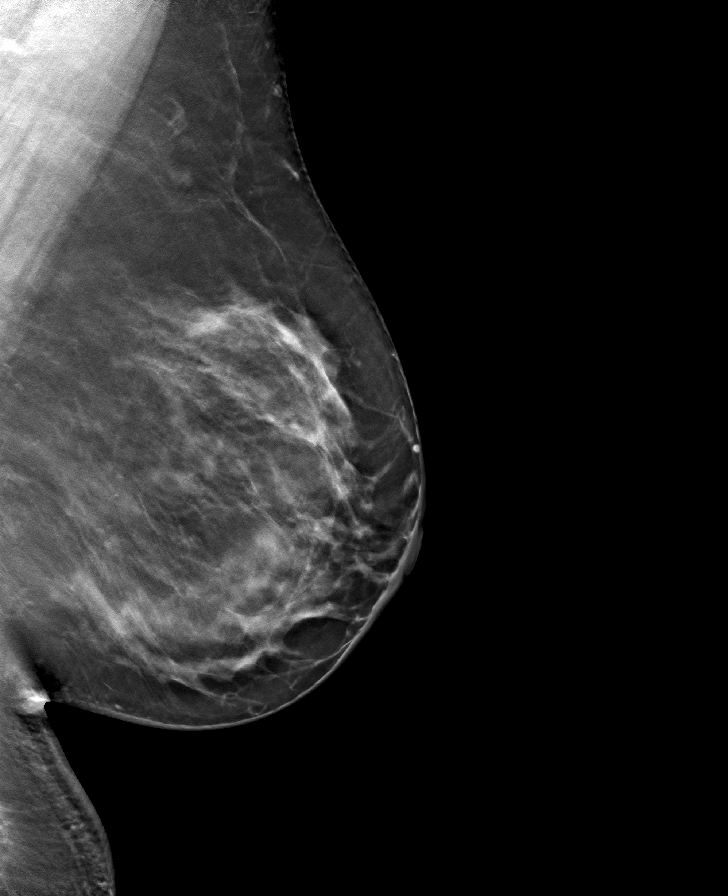

[R MLO tomo · tomo slice 49/96.0]
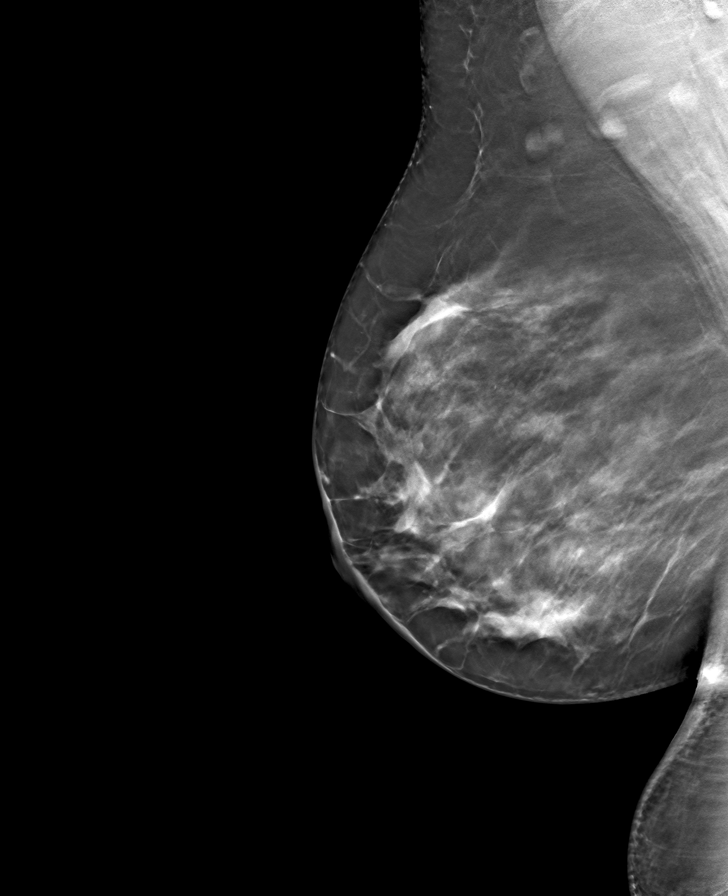

[L CC tomo · tomo slice 45/89.0]
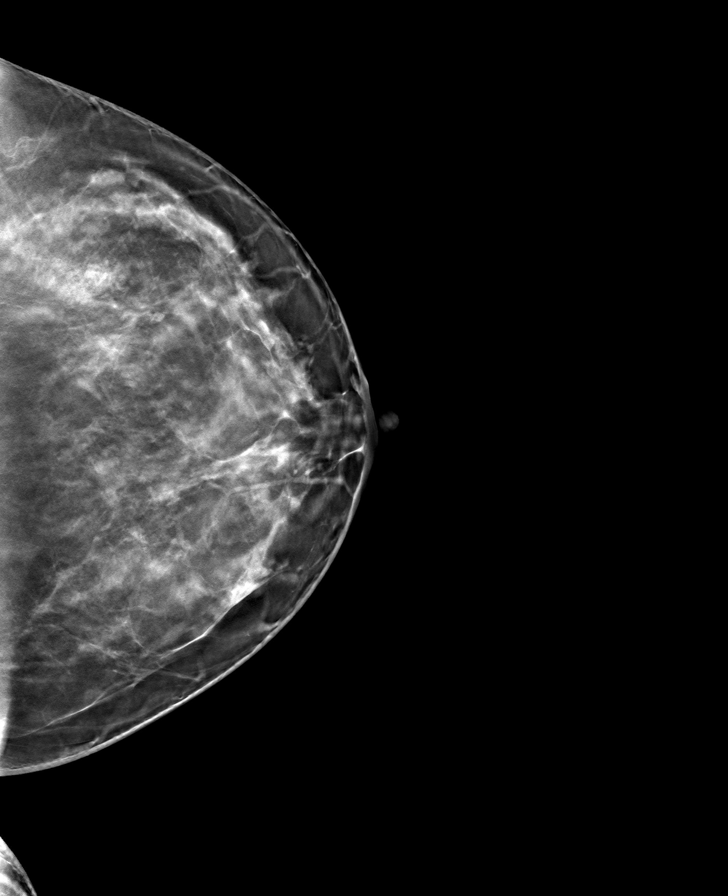

[8 of 24 positions shown; findings below may reference images not displayed]

ACR Breast Density Category c: The breast tissue is heterogeneously
dense, which may obscure small masses.
FINDINGS: In the right breast, a possible mass warrants further evaluation.
This possible mass is seen within the lower RIGHT breast, MLO slice
81, possible correlate within the slightly outer RIGHT breast on cc
slice 75.

In the left breast, no findings suspicious for malignancy.
IMPRESSION: Further evaluation is suggested for a possible mass in the right
breast.

RECOMMENDATION:
Diagnostic mammogram and possibly ultrasound of the right breast.
(Code:ST-[id number])

The patient will be contacted regarding the findings, and additional
imaging will be scheduled.

BI-RADS CATEGORY  0: Incomplete. Need additional imaging evaluation
and/or prior mammograms for comparison.

## 2022-09-08 DIAGNOSIS — G4733 Obstructive sleep apnea (adult) (pediatric): Secondary | ICD-10-CM | POA: Diagnosis not present

## 2023-02-17 DIAGNOSIS — G4733 Obstructive sleep apnea (adult) (pediatric): Secondary | ICD-10-CM | POA: Diagnosis not present

## 2023-03-09 ENCOUNTER — Other Ambulatory Visit: Payer: Self-pay | Admitting: Internal Medicine

## 2023-03-09 DIAGNOSIS — Z1231 Encounter for screening mammogram for malignant neoplasm of breast: Secondary | ICD-10-CM

## 2023-03-20 ENCOUNTER — Ambulatory Visit
Admission: RE | Admit: 2023-03-20 | Discharge: 2023-03-20 | Disposition: A | Discharge status: Home / Self Care | Payer: BC Managed Care – PPO | Source: Ambulatory Visit | Attending: Internal Medicine | Admitting: Internal Medicine

## 2023-03-20 DIAGNOSIS — Z1231 Encounter for screening mammogram for malignant neoplasm of breast: Secondary | ICD-10-CM

## 2023-03-30 DIAGNOSIS — Z0000 Encounter for general adult medical examination without abnormal findings: Secondary | ICD-10-CM | POA: Diagnosis not present

## 2023-04-02 DIAGNOSIS — Z23 Encounter for immunization: Secondary | ICD-10-CM | POA: Diagnosis not present

## 2023-04-02 DIAGNOSIS — R7989 Other specified abnormal findings of blood chemistry: Secondary | ICD-10-CM | POA: Diagnosis not present

## 2023-04-02 DIAGNOSIS — D751 Secondary polycythemia: Secondary | ICD-10-CM | POA: Diagnosis not present

## 2023-04-02 DIAGNOSIS — Z0000 Encounter for general adult medical examination without abnormal findings: Secondary | ICD-10-CM | POA: Diagnosis not present

## 2023-04-02 DIAGNOSIS — E875 Hyperkalemia: Secondary | ICD-10-CM | POA: Diagnosis not present

## 2023-05-19 DIAGNOSIS — G4733 Obstructive sleep apnea (adult) (pediatric): Secondary | ICD-10-CM | POA: Diagnosis not present

## 2023-06-16 DIAGNOSIS — Z124 Encounter for screening for malignant neoplasm of cervix: Secondary | ICD-10-CM | POA: Diagnosis not present

## 2023-06-16 DIAGNOSIS — R6882 Decreased libido: Secondary | ICD-10-CM | POA: Diagnosis not present

## 2023-06-16 DIAGNOSIS — Z01419 Encounter for gynecological examination (general) (routine) without abnormal findings: Secondary | ICD-10-CM | POA: Diagnosis not present

## 2023-06-16 DIAGNOSIS — Z6837 Body mass index (BMI) 37.0-37.9, adult: Secondary | ICD-10-CM | POA: Diagnosis not present

## 2023-10-07 DIAGNOSIS — Z23 Encounter for immunization: Secondary | ICD-10-CM | POA: Diagnosis not present
